# Patient Record
Sex: Female | Born: 1990 | Race: White | Hispanic: Yes | Marital: Single | Smoking: Former smoker
Health system: Southern US, Community
[De-identification: ages and names within clinical notes are randomized; demographics above are authoritative.]

## PROBLEM LIST (undated history)

## (undated) DIAGNOSIS — M76899 Other specified enthesopathies of unspecified lower limb, excluding foot: Secondary | ICD-10-CM

## (undated) DIAGNOSIS — F909 Attention-deficit hyperactivity disorder, unspecified type: Secondary | ICD-10-CM

## (undated) DIAGNOSIS — R1031 Right lower quadrant pain: Secondary | ICD-10-CM

## (undated) DIAGNOSIS — Z8619 Personal history of other infectious and parasitic diseases: Secondary | ICD-10-CM

## (undated) DIAGNOSIS — R5383 Other fatigue: Secondary | ICD-10-CM

## (undated) DIAGNOSIS — S3981XA Other specified injuries of abdomen, initial encounter: Secondary | ICD-10-CM

## (undated) DIAGNOSIS — G4700 Insomnia, unspecified: Secondary | ICD-10-CM

## (undated) DIAGNOSIS — F338 Other recurrent depressive disorders: Secondary | ICD-10-CM

## (undated) DIAGNOSIS — F988 Other specified behavioral and emotional disorders with onset usually occurring in childhood and adolescence: Secondary | ICD-10-CM

## (undated) DIAGNOSIS — G894 Chronic pain syndrome: Secondary | ICD-10-CM

## (undated) DIAGNOSIS — F5104 Psychophysiologic insomnia: Secondary | ICD-10-CM

## (undated) DIAGNOSIS — M722 Plantar fascial fibromatosis: Secondary | ICD-10-CM

## (undated) DIAGNOSIS — R1032 Left lower quadrant pain: Secondary | ICD-10-CM

## (undated) HISTORY — DX: Other specified behavioral and emotional disorders with onset usually occurring in childhood and adolescence: F98.8

## (undated) HISTORY — DX: Chronic pain syndrome: G89.4

## (undated) HISTORY — PX: HERNIA REPAIR: SHX51

## (undated) HISTORY — PX: LAPAROSCOPIC INGUINAL HERNIA REPAIR: SUR788

## (undated) HISTORY — PX: COSMETIC SURGERY: SHX468

## (undated) HISTORY — DX: Insomnia, unspecified: G47.00

## (undated) HISTORY — DX: Other specified injuries of abdomen, initial encounter: S39.81XA

## (undated) HISTORY — DX: Other recurrent depressive disorders: F33.8

---

## 2011-04-16 DIAGNOSIS — F338 Other recurrent depressive disorders: Secondary | ICD-10-CM

## 2011-04-16 HISTORY — DX: Other recurrent depressive disorders: F33.8

## 2017-11-24 ENCOUNTER — Ambulatory Visit (INDEPENDENT_AMBULATORY_CARE_PROVIDER_SITE_OTHER): Payer: BLUE CROSS/BLUE SHIELD | Admitting: Family Medicine

## 2017-11-24 ENCOUNTER — Encounter: Payer: Self-pay | Admitting: Family Medicine

## 2017-11-24 VITALS — BP 94/58 | HR 103 | Temp 98.9°F | Resp 16 | Ht 66.5 in | Wt 123.0 lb

## 2017-11-24 DIAGNOSIS — F5101 Primary insomnia: Secondary | ICD-10-CM | POA: Diagnosis not present

## 2017-11-24 DIAGNOSIS — F39 Unspecified mood [affective] disorder: Secondary | ICD-10-CM

## 2017-11-24 DIAGNOSIS — G4701 Insomnia due to medical condition: Secondary | ICD-10-CM | POA: Diagnosis not present

## 2017-11-24 DIAGNOSIS — E559 Vitamin D deficiency, unspecified: Secondary | ICD-10-CM

## 2017-11-24 DIAGNOSIS — G8921 Chronic pain due to trauma: Secondary | ICD-10-CM

## 2017-11-24 DIAGNOSIS — R5383 Other fatigue: Secondary | ICD-10-CM

## 2017-11-24 DIAGNOSIS — F902 Attention-deficit hyperactivity disorder, combined type: Secondary | ICD-10-CM

## 2017-11-24 MED ORDER — AMPHETAMINE-DEXTROAMPHET ER 10 MG PO CP24: 10.0000 mg | ORAL_CAPSULE | Freq: Every day | ORAL | 0 refills | Status: DC

## 2017-11-24 MED ORDER — AMITRIPTYLINE HCL 10 MG PO TABS: 10.0000 mg | ORAL_TABLET | Freq: Every day | ORAL | 1 refills | Status: DC

## 2017-11-24 NOTE — Progress Notes (Signed)
Subjective:    Patient ID: Erin LewandowskyLauren Pierce, female    DOB: 25-Sep-1990, 27 y.o.   MRN: [id number] Chief Complaint  Patient presents with  . Establish Care    HPI  Leotis ShamesLauren is a delightful 27 yo woman who is here to establish care on the recommendation of her therapist (sees Harless LittenShana Gordon at [facility]).  Chronic pain and MSK Dysfunction Moved to [place] to [place] where she was diagnosed w/ the sports hernias then she moved down to [place] for work but she had to go back there to get diagnosed w/ the adenopathy Starting Yoga which is she is very hopeful will help immensely.   Has not really found anything to help her deal with the constant pain which has required her to stop playing sports and being physically active/exercising which she really loves and has always been her therapy.  Gabapentin for a while which was terrible, Has also failed meloxicam, naproxen, tylenol #3.  No other muscle relaxants helped but her therapist Edson SnowballShana recommended she discuss the possibility of trying zanaflex and valium with me. Feels like physically once one thing starts to get better, than something else goes wrong till eventually eveyrthing is wrong. Very depressing  Mother moved down from [place] to move in w/ her a month ago to help her out which has helped tremendously.   ADHD Was rx adderall for while but took self off during surgery - poor concerntration but no energy to focus it.  Teachers have told her when she was in school that she couldn't focus and had ADD but it was officially diagnosed at about 5818-19 yo when she choose to have herself tested Works at [employer] - can't process things they way she used to  Mood d/o Mild depression really got much worse and hit her sev mos ago when Dr. Jimmie MollyBogges (her Sports Med DO at [facility]) told her he didn't know what the next steps would be to get her out of pain.  dx'd w/ SAD 6-7 yrs ago - she was put on lexapro but she also purchased a very loved  horse at that same time which she really enjoyed riding - so her depression/dysthimia sxs improved immensely but she is not sure if it was the lexapro or the horse (suspect latter).  Starting therapy [facility] has helped a lot - she understands the emotional toll that chronic pain takes but was rec to restart on meds. Insomnia/Fatigue Always tired for several years. Doesn't sleep. Mother and aunts have always been on sleeping meds so always had a little sxs of insomnia that she just attributed to always running in the family. In bed by 9:30 - takes zquil which helps - but wakes up at 1 or 2 but sometimes she doesn't fall back to sleep until 5-6 a.m.     History reviewed. No pertinent past medical history. Past Surgical History:  Procedure Laterality Date  . COSMETIC SURGERY    . HERNIA REPAIR     Current Outpatient Medications on File Prior to Visit  Medication Sig Dispense Refill  . valACYclovir (VALTREX) 500 MG tablet Take 500 mg by mouth 2 (two) times daily.    Marland Kitchen. ACZONE 7.5 % GEL Apply 1 application topically at bedtime. After application of Tazorac  2  . diclofenac sodium (VOLTAREN) 1 % GEL Apply 2 g topically 4 (four) times daily. To affected area  11  . valACYclovir (VALTREX) 1000 MG tablet Take  2 tablets by mouth every 12 (twelve) hours. x2 d after first sign of fever blister  2   No current facility-administered medications on file prior to visit.    No Known Allergies Family History  Problem Relation Age of Onset  . Asthma Maternal Grandmother    Social History   Socioeconomic History  . Marital status: Single    Spouse name: Not on file  . Number of children: Not on file  . Years of education: Not on file  . Highest education level: Not on file  Occupational History  . Not on file  Social Needs  . Financial resource strain: Not on file  . Food insecurity:    Worry: Not on file    Inability: Not on file  . Transportation needs:    Medical: Not on  file    Non-medical: Not on file  Tobacco Use  . Smoking status: Not on file  Substance and Sexual Activity  . Alcohol use: Not on file  . Drug use: Not on file  . Sexual activity: Not on file  Lifestyle  . Physical activity:    Days per week: Not on file    Minutes per session: Not on file  . Stress: Not on file  Relationships  . Social connections:    Talks on phone: Not on file    Gets together: Not on file    Attends religious service: Not on file    Active member of club or organization: Not on file    Attends meetings of clubs or organizations: Not on file    Relationship status: Not on file  Other Topics Concern  . Not on file  Social History Narrative  . Not on file   Depression screen [facility] 2/9 11/24/2017  Decreased Interest 1  Down, Depressed, Hopeless 1  PHQ - 2 Score 2  Altered sleeping 3  Tired, decreased energy 3  Change in appetite 1  Feeling bad or failure about yourself  0  Trouble concentrating 3  Moving slowly or fidgety/restless 0  Suicidal thoughts 0  PHQ-9 Score 12  Difficult doing work/chores Very difficult    Review of Systems See hpi    Objective:   Physical Exam  Constitutional: She is oriented to person, place, and time. She appears well-developed and well-nourished. No distress.  HENT:  Head: Normocephalic and atraumatic.  Right Ear: External ear normal.  Left Ear: External ear normal.  Eyes: Conjunctivae are normal. No scleral icterus.  Neck: Normal range of motion. Neck supple. No thyromegaly present.  Cardiovascular: Normal rate, regular rhythm, normal heart sounds and intact distal pulses.  Pulmonary/Chest: Effort normal and breath sounds normal. No respiratory distress.  Musculoskeletal: She exhibits no edema.  Lymphadenopathy:    She has no cervical adenopathy.  Neurological: She is alert and oriented to person, place, and time.  Skin: Skin is warm and dry. She is not diaphoretic. No erythema.  Psychiatric: She has a normal  mood and affect. Her behavior is normal.  BP (!) 94/58 (BP Location: Left Arm, Patient Position: Sitting, Cuff Size: Normal)   Pulse (!) 103   Temp 98.9 F (37.2 C) (Oral)   Resp 16   Ht 5' 6.5" (1.689 m)   Wt 123 lb (55.8 kg)   LMP 11/24/2017   SpO2 99%   BMI 19.56 kg/m      Assessment & Plan:   1. Episodic mood disorder (HCC)   2. Chronic pain due to trauma  3. Primary insomnia   4. Insomnia due to medical condition   5. Attention deficit hyperactivity disorder (ADHD), combined type   6. Fatigue, unspecified type   7. Vitamin D deficiency     Start below, recheck in 3-4 wks.  Meds ordered this encounter  Medications  . amitriptyline (ELAVIL) 10 MG tablet    Sig: Take 1 tablet (10 mg total) by mouth at bedtime.    Dispense:  30 tablet    Refill:  1  . amphetamine-dextroamphetamine (ADDERALL XR) 10 MG 24 hr capsule    Sig: Take 1 capsule (10 mg total) by mouth daily.    Dispense:  30 capsule    Refill:  0   Today I have utilized the [place] Cont[place] Substance Registry's online query to confirm compliance regarding the patient's controlled medications. My review reveals appropriate prescription fills and that I am the sole provider of these medications. Rechecks will occur regularly and the patient is aware of our use of the system.   Norberto SorensonEva Kyriaki Moder, M.D.  Primary Care at [facility]  [place] [address] [place], [place] [address] [phone] phone [phone] fax  12/26/17 2:14 PM

## 2017-11-24 NOTE — Patient Instructions (Addendum)
   IF you received an x-ray today, you will receive an invoice from [facility]. Please contact [facility] at [phone] with questions or concerns regarding your invoice.   IF you received labwork today, you will receive an invoice from [facility]. Please contact [facility] at [phone] with questions or concerns regarding your invoice.   Our billing staff will not be able to assist you with questions regarding bills from these companies.  You will be contacted with the lab results as soon as they are available. The fastest way to get your results is to activate your My Chart account. Instructions are located on the last page of this paperwork. If you have not heard from us regarding the results in 2 weeks, please contact this office.       Chronic Pain, Adult Chronic pain is a type of pain that lasts or keeps coming back (recurs) for at least six months. You may have chronic headaches, abdominal pain, or body pain. Chronic pain may be related to an illness, such as fibromyalgia or complex regional pain syndrome. Sometimes the cause of chronic pain is not known. Chronic pain can make it hard for you to do daily activities. If not treated, chronic pain can lead to other health problems, including anxiety and depression. Treatment depends on the cause and severity of your pain. You may need to work with a pain specialist to come up with a treatment plan. The plan may include medicine, counseling, and physical therapy. Many people benefit from a combination of two or more types of treatment to control their pain. Follow these instructions at home: Lifestyle  Consider keeping a pain diary to share with your health care providers.  Consider talking with a mental health care provider (psychologist) about how to cope with chronic pain.  Consider joining a chronic pain support group.  Try to control or lower your stress levels. Talk to your health care provider about  strategies to do this. General instructions   Take over-the-counter and prescription medicines only as told by your health care provider.  Follow your treatment plan as told by your health care provider. This may include: ? Gentle, regular exercise. ? Eating a healthy diet that includes foods such as vegetables, fruits, fish, and lean meats. ? Cognitive or behavioral therapy. ? Working with a physical therapist. ? Meditation or yoga. ? Acupuncture or massage therapy. ? Aroma, color, light, or sound therapy. ? Local electrical stimulation. ? Shots (injections) of numbing or pain-relieving medicines into the spine or the area of pain.  Check your pain level as told by your health care provider. Ask your health care provider if you should use a pain scale.  Learn as much as you can about how to manage your chronic pain. Ask your health care provider if an intensive pain rehabilitation program or a chronic pain specialist would be helpful.  Keep all follow-up visits as told by your health care provider. This is important. Contact a health care provider if:  Your pain gets worse.  You have new pain.  You have trouble sleeping.  You have trouble doing your normal activities.  Your pain is not controlled with treatment.  Your have side effects from pain medicine.  You feel weak. Get help right away if:  You lose feeling or have numbness in your body.  You lose control of bowel or bladder function.  Your pain suddenly gets much worse.  You develop shaking or chills.  You develop confusion.    You develop chest pain.  You have trouble breathing or shortness of breath.  You pass out.  You have thoughts about hurting yourself or others. This information is not intended to replace advice given to you by your health care provider. Make sure you discuss any questions you have with your health care provider. Document Released: 12/22/2001 Document Revised: 11/30/2015 Document  Reviewed: 09/19/2015 Elsevier Interactive Patient Education  2018 Elsevier Inc.  

## 2017-12-18 ENCOUNTER — Telehealth: Payer: Self-pay | Admitting: Family Medicine

## 2017-12-18 NOTE — Telephone Encounter (Signed)
I moved the patients appt from 12/26/2017 to 01/19/2018. But in the meantime, she is wanting to know if she can get a prescription for Adderall XR, and Elavil to last until her appt time. Also, she mentioned wanted to get a blood test because she was told that she may have low D3 levels.

## 2017-12-22 NOTE — Telephone Encounter (Signed)
Please advise 

## 2017-12-23 ENCOUNTER — Other Ambulatory Visit: Payer: Self-pay | Admitting: Family Medicine

## 2017-12-23 MED ORDER — AMPHETAMINE-DEXTROAMPHET ER 10 MG PO CP24: 10.0000 mg | ORAL_CAPSULE | Freq: Every day | ORAL | 0 refills | Status: DC

## 2017-12-23 NOTE — Telephone Encounter (Signed)
Relation to pt: self  Call back number:[phone] Pharmacy: [facility] [phone] - [place], [place] - [address] AT [facility] [address] [phone] (Phone) [phone] (Fax)     Reason for call:  Patient checking on the status of amphetamine-dextroamphetamine (ADDERALL XR) 10 MG 24 hr capsule refill request sent in on 12/18/17, please advise

## 2017-12-23 NOTE — Telephone Encounter (Signed)
Just received a call from the [facility] and pt is concerned about her refills.   Please advise on refills for pt has an upcoming appointment on 01/2018 with Dr. Clelia Croft and she was last seen on 11/24/2017.  Pt will be out of the Adderall in the next couple of days.   Thanks, [employer]

## 2017-12-23 NOTE — Telephone Encounter (Signed)
Amitriptyline refill Last Refill: 11/24/17 # 30 Last OV: 11/24/17 PCP: Dr Norberto Sorenson Pharmacy: [facility] [place], [place]

## 2017-12-25 ENCOUNTER — Telehealth: Payer: Self-pay | Admitting: Family Medicine

## 2017-12-25 NOTE — Telephone Encounter (Signed)
Called pt to reschedule visit with Dr. Clelia CroftShaw on 01/19/18. Left Detailed VM. If pt. Calls back please reschedule with any doctor

## 2017-12-26 ENCOUNTER — Encounter: Payer: Self-pay | Admitting: Family Medicine

## 2017-12-26 ENCOUNTER — Ambulatory Visit: Payer: BLUE CROSS/BLUE SHIELD | Admitting: Family Medicine

## 2017-12-26 DIAGNOSIS — F39 Unspecified mood [affective] disorder: Secondary | ICD-10-CM | POA: Insufficient documentation

## 2017-12-26 DIAGNOSIS — F5101 Primary insomnia: Secondary | ICD-10-CM | POA: Insufficient documentation

## 2017-12-26 DIAGNOSIS — F902 Attention-deficit hyperactivity disorder, combined type: Secondary | ICD-10-CM | POA: Insufficient documentation

## 2017-12-26 DIAGNOSIS — G8921 Chronic pain due to trauma: Secondary | ICD-10-CM | POA: Insufficient documentation

## 2017-12-26 DIAGNOSIS — G4701 Insomnia due to medical condition: Secondary | ICD-10-CM | POA: Insufficient documentation

## 2017-12-26 MED ORDER — AMITRIPTYLINE HCL 25 MG PO TABS: 25.0000 mg | ORAL_TABLET | Freq: Every day | ORAL | 2 refills | Status: DC

## 2017-12-26 MED ORDER — AMPHETAMINE-DEXTROAMPHET ER 20 MG PO CP24: 20.0000 mg | ORAL_CAPSULE | ORAL | 0 refills | Status: DC

## 2017-12-26 NOTE — Telephone Encounter (Signed)
Pt's appt is now sched for 11/7  - so will also send in refill for oct for her to have on file and order labs so she can come in for lab only visit - pt notified by MyChart

## 2018-01-15 ENCOUNTER — Encounter: Payer: Self-pay | Admitting: Physician Assistant

## 2018-01-15 ENCOUNTER — Other Ambulatory Visit: Payer: Self-pay

## 2018-01-15 ENCOUNTER — Ambulatory Visit (INDEPENDENT_AMBULATORY_CARE_PROVIDER_SITE_OTHER): Payer: BLUE CROSS/BLUE SHIELD | Admitting: Physician Assistant

## 2018-01-15 VITALS — BP 102/71 | HR 91 | Temp 98.9°F | Resp 18 | Ht 66.85 in | Wt 122.0 lb

## 2018-01-15 DIAGNOSIS — R5383 Other fatigue: Secondary | ICD-10-CM

## 2018-01-15 MED ORDER — AMPHETAMINE-DEXTROAMPHETAMINE 10 MG PO TABS: 10.0000 mg | ORAL_TABLET | Freq: Every day | ORAL | 0 refills | Status: DC | PRN

## 2018-01-15 NOTE — Patient Instructions (Addendum)
  Start taking Adderall 10 mg in the afternoon. If needed, you can increase to 20mg .  Start taking Amitriptyline 35mg  after 1-2 weeks.  Keep your appointment with Dr. Clelia Croft next month.   If you have lab work done today you will be contacted with your lab results within the next 2 weeks.  If you have not heard from [place] then please contact us. The fastest way to get your results is to register for My Chart.   IF you received an x-ray today, you will receive an invoice from [facility]. Please contact [facility] at [phone] with questions or concerns regarding your invoice.   IF you received labwork today, you will receive an invoice from [place]. Please contact [facility] at [phone] with questions or concerns regarding your invoice.   Our billing staff will not be able to assist you with questions regarding bills from these companies.  You will be contacted with the lab results as soon as they are available. The fastest way to get your results is to activate your My Chart account. Instructions are located on the last page of this paperwork. If you have not heard from [place] regarding the results in 2 weeks, please contact this office.

## 2018-01-15 NOTE — Progress Notes (Signed)
Alexandra Lipps  MRN: [id number] DOB: January 07, 1991  PCP: Sherren Mocha, MD  Subjective:  Pt is a 27 year old female PMH chronic pain and fatigue who presents to clinic for medication management.   Overall, she is feeling improvement since starting Amitriptaline and Adderall.  Amitriptaline 25 mg. Still waking up groggy. "It's a push to get anything done" She never feels well rested.  Adderall 20 mg qd. Takes this first thing in the morning.  [facility]renda [facility] at [facility].  Recent labs done for rheumatalgic condtions which were negative.   She has failed Gabapentin, meloxicam, naproxen, tylenol #3.   Review of Systems  Constitutional: Positive for fatigue. Negative for diaphoresis, fever and unexpected weight change.  Musculoskeletal: Positive for arthralgias and myalgias. Negative for neck pain and neck stiffness.    Patient Active Problem List   Diagnosis Date Noted  . Episodic mood disorder (HCC) 12/26/2017  . Chronic pain due to trauma 12/26/2017  . Primary insomnia 12/26/2017  . Insomnia due to medical condition 12/26/2017  . Attention deficit hyperactivity disorder (ADHD), combined type 12/26/2017    Current Outpatient Medications on File Prior to Visit  Medication Sig Dispense Refill  . ACZONE 7.5 % GEL Apply 1 application topically at bedtime. After application of Tazorac  2  . amitriptyline (ELAVIL) 25 MG tablet Take 1 tablet (25 mg total) by mouth at bedtime. Increase to 2 tabs po qhs if tolerated after 2-3 weeks. 60 tablet 2  . amphetamine-dextroamphetamine (ADDERALL XR) 10 MG 24 hr capsule Take 1 capsule (10 mg total) by mouth daily. 30 capsule 0  . [START ON 02/05/2018] amphetamine-dextroamphetamine (ADDERALL XR) 20 MG 24 hr capsule Take 1 capsule (20 mg total) by mouth every morning. 30 capsule 0  . amphetamine-dextroamphetamine (ADDERALL XR) 20 MG 24 hr capsule Take 1 capsule (20 mg total) by mouth every morning. 30 capsule 0  . diclofenac sodium (VOLTAREN) 1 %  GEL Apply 2 g topically 4 (four) times daily. To affected area  11  . valACYclovir (VALTREX) 1000 MG tablet Take 2 tablets by mouth every 12 (twelve) hours. x2 d after first sign of fever blister  2  . valACYclovir (VALTREX) 500 MG tablet Take 500 mg by mouth 2 (two) times daily.    . clindamycin (CLINDAGEL) 1 % gel clindamycin 1 % topical gel    . tazarotene (TAZORAC) 0.1 % gel Tazorac 0.1 % topical gel     No current facility-administered medications on file prior to visit.     No Known Allergies   Objective:  BP 102/71   Pulse 91   Temp 98.9 F (37.2 C) (Oral)   Resp 18   Ht 5' 6.85" (1.698 m)   Wt 122 lb (55.3 kg)   SpO2 98%   BMI 19.19 kg/m   Physical Exam  Constitutional: She is oriented to person, place, and time. No distress.  Cardiovascular: Normal rate, regular rhythm and normal heart sounds.  Neurological: She is alert and oriented to person, place, and time.  Skin: Skin is warm and dry.  Psychiatric: Judgment normal.  Vitals reviewed.   Assessment and Plan :  1. Fatigue, unspecified type - Pt presents for f/u chronic fatigue and pain. Recent rheum labs negative, will check iron and vitamin B12. Con't adderall XR qam and add adderall 10mg  in the afternoon. RTC in 1-2 months to recheck with Dr. Clelia Croft.  - Iron, TIBC and Ferritin Panel - Vitamin B12 - amphetamine-dextroamphetamine (ADDERALL) 10 MG  tablet; Take 1-2 tablets (10-20 mg total) by mouth daily as needed.  Dispense: 60 tablet; Refill: 0   Whitney Blia Totman, PA-C  Primary Care at [facility] 01/15/2018 2:16 PM  Please note: Portions of this report may have been transcribed using dragon voice recognition software. Every effort was made to ensure accuracy; however, inadvertent computerized transcription errors may be present.

## 2018-01-16 LAB — VITAMIN B12: Vitamin B-12: 515 pg/mL (ref 232–1245)

## 2018-01-16 LAB — IRON,TIBC AND FERRITIN PANEL
Ferritin: 31 ng/mL (ref 15–150)
Iron Saturation: 41 % (ref 15–55)
Iron: 109 ug/dL (ref 27–159)
Total Iron Binding Capacity: 268 ug/dL (ref 250–450)
UIBC: 159 ug/dL (ref 131–425)

## 2018-01-19 ENCOUNTER — Ambulatory Visit: Payer: BLUE CROSS/BLUE SHIELD | Admitting: Family Medicine

## 2018-02-19 ENCOUNTER — Other Ambulatory Visit: Payer: Self-pay

## 2018-02-19 ENCOUNTER — Encounter: Payer: Self-pay | Admitting: Family Medicine

## 2018-02-19 ENCOUNTER — Ambulatory Visit (INDEPENDENT_AMBULATORY_CARE_PROVIDER_SITE_OTHER): Payer: BLUE CROSS/BLUE SHIELD | Admitting: Family Medicine

## 2018-02-19 VITALS — BP 114/81 | HR 97 | Resp 16 | Ht 66.54 in | Wt 119.0 lb

## 2018-02-19 DIAGNOSIS — E611 Iron deficiency: Secondary | ICD-10-CM

## 2018-02-19 DIAGNOSIS — M25552 Pain in left hip: Secondary | ICD-10-CM

## 2018-02-19 DIAGNOSIS — G8921 Chronic pain due to trauma: Secondary | ICD-10-CM

## 2018-02-19 DIAGNOSIS — F902 Attention-deficit hyperactivity disorder, combined type: Secondary | ICD-10-CM

## 2018-02-19 DIAGNOSIS — R5383 Other fatigue: Secondary | ICD-10-CM | POA: Diagnosis not present

## 2018-02-19 DIAGNOSIS — F329 Major depressive disorder, single episode, unspecified: Secondary | ICD-10-CM

## 2018-02-19 DIAGNOSIS — F5101 Primary insomnia: Secondary | ICD-10-CM

## 2018-02-19 DIAGNOSIS — M25551 Pain in right hip: Secondary | ICD-10-CM

## 2018-02-19 MED ORDER — AMPHETAMINE-DEXTROAMPHETAMINE 10 MG PO TABS: 10.0000 mg | ORAL_TABLET | Freq: Two times a day (BID) | ORAL | 0 refills | Status: DC | PRN

## 2018-02-19 MED ORDER — AMITRIPTYLINE HCL 75 MG PO TABS: 75.0000 mg | ORAL_TABLET | Freq: Every day | ORAL | 1 refills | Status: DC

## 2018-02-19 MED ORDER — AMPHETAMINE-DEXTROAMPHET ER 20 MG PO CP24: 20.0000 mg | ORAL_CAPSULE | ORAL | 0 refills | Status: DC

## 2018-02-19 MED ORDER — DULOXETINE HCL 30 MG PO CPEP: ORAL_CAPSULE | ORAL | 1 refills | Status: DC

## 2018-02-19 MED ORDER — CYCLOBENZAPRINE HCL 10 MG PO TABS: 10.0000 mg | ORAL_TABLET | Freq: Every day | ORAL | 2 refills | Status: DC

## 2018-02-19 NOTE — Patient Instructions (Addendum)
I recommend starting a daily iron supplement like ferrous sulfate 324mg  (65mg  of elemental iron) which has the highest iron dose of the products commonly available over-the-counter.  It is best absorbed on an empty stomach 30 minutes before eating or 2 hours after a meal with a vitamin C supplement or a small glass of orange juice. This can cause significant constipation in some patients, in which case you may need to switch to a slow release, lower dose supplement like MegaFood Blood Builder (26mg  of elemental iron) which you can find on [employer], Nu-Iron (150mg  of elemental iron), or Slow-Fe (45mg  of elemental iron). Avoid taking antacids, dairy products, tea, or coffee within 2 hours before or after this supplement because they will decrease the iron's absorption.     If you have lab work done today you will be contacted with your lab results within the next 2 weeks.  If you have not heard from [place] then please contact us. The fastest way to get your results is to register for My Chart.   IF you received an x-ray today, you will receive an invoice from [facility]. Please contact [facility] at [phone] with questions or concerns regarding your invoice.   IF you received labwork today, you will receive an invoice from [place]. Please contact [facility] at [phone] with questions or concerns regarding your invoice.   Our billing staff will not be able to assist you with questions regarding bills from these companies.  You will be contacted with the lab results as soon as they are available. The fastest way to get your results is to activate your My Chart account. Instructions are located on the last page of this paperwork. If you have not heard from [place] regarding the results in 2 weeks, please contact this office.      Chronic Pain, Adult Chronic pain is a type of pain that lasts or keeps coming back (recurs) for at least six months. You may have chronic headaches,  abdominal pain, or body pain. Chronic pain may be related to an illness, such as fibromyalgia or complex regional pain syndrome. Sometimes the cause of chronic pain is not known. Chronic pain can make it hard for you to do daily activities. If not treated, chronic pain can lead to other health problems, including anxiety and depression. Treatment depends on the cause and severity of your pain. You may need to work with a pain specialist to come up with a treatment plan. The plan may include medicine, counseling, and physical therapy. Many people benefit from a combination of two or more types of treatment to control their pain. Follow these instructions at home: Lifestyle  Consider keeping a pain diary to share with your health care providers.  Consider talking with a mental health care provider (psychologist) about how to cope with chronic pain.  Consider joining a chronic pain support group.  Try to control or lower your stress levels. Talk to your health care provider about strategies to do this. General instructions   Take over-the-counter and prescription medicines only as told by your health care provider.  Follow your treatment plan as told by your health care provider. This may include: ? Gentle, regular exercise. ? Eating a healthy diet that includes foods such as vegetables, fruits, fish, and lean meats. ? Cognitive or behavioral therapy. ? Working with a Adult nurse. ? Meditation or yoga. ? Acupuncture or massage therapy. ? Aroma, color, light, or sound therapy. ? Local electrical stimulation. ? Shots (injections)  of numbing or pain-relieving medicines into the spine or the area of pain.  Check your pain level as told by your health care provider. Ask your health care provider if you should use a pain scale.  Learn as much as you can about how to manage your chronic pain. Ask your health care provider if an intensive pain rehabilitation program or a chronic pain  specialist would be helpful.  Keep all follow-up visits as told by your health care provider. This is important. Contact a health care provider if:  Your pain gets worse.  You have new pain.  You have trouble sleeping.  You have trouble doing your normal activities.  Your pain is not controlled with treatment.  Your have side effects from pain medicine.  You feel weak. Get help right away if:  You lose feeling or have numbness in your body.  You lose control of bowel or bladder function.  Your pain suddenly gets much worse.  You develop shaking or chills.  You develop confusion.  You develop chest pain.  You have trouble breathing or shortness of breath.  You pass out.  You have thoughts about hurting yourself or others. This information is not intended to replace advice given to you by your health care provider. Make sure you discuss any questions you have with your health care provider. Document Released: 12/22/2001 Document Revised: 11/30/2015 Document Reviewed: 09/19/2015 Elsevier Interactive Patient Education  [employer].
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
## 2018-03-05 ENCOUNTER — Telehealth: Payer: Self-pay | Admitting: Family Medicine

## 2018-03-05 NOTE — Telephone Encounter (Signed)
Called and spoke with pt regarding their appt with Dr. Shaw on 06/01/18. Due to Dr. Shaw’s template change, appt time is pushed up by 10 minutes. I asked pt if this was acceptable and pt agreed. I advised pt of time, building number and late policy. Pt acknowledged.  °

## 2018-03-09 DIAGNOSIS — M25551 Pain in right hip: Secondary | ICD-10-CM | POA: Insufficient documentation

## 2018-03-09 DIAGNOSIS — R5383 Other fatigue: Secondary | ICD-10-CM | POA: Insufficient documentation

## 2018-03-09 DIAGNOSIS — F329 Major depressive disorder, single episode, unspecified: Secondary | ICD-10-CM | POA: Insufficient documentation

## 2018-03-09 DIAGNOSIS — M25552 Pain in left hip: Principal | ICD-10-CM

## 2018-04-21 ENCOUNTER — Other Ambulatory Visit: Payer: Self-pay | Admitting: Family Medicine

## 2018-04-21 NOTE — Telephone Encounter (Signed)
.   Requested Prescriptions  Pending Prescriptions Disp Refills  . DULoxetine (CYMBALTA) 30 MG capsule [Pharmacy Med Name: DULOXETINE DR 30MG  CAPSULES] 60 capsule 0    Sig: TAKE 1 CAPSULE BY MOUTH EVERY DAY FOR 2 WEEKS THEN TAKE 2 CAPSULES BY MOUTH EVERY DAY     Psychiatry: Antidepressants - SNRI Passed - 04/21/2018 10:23 AM      Passed - Completed PHQ-2 or PHQ-9 in the last 360 days.      Passed - Last BP in normal range    BP Readings from Last 1 Encounters:  02/19/18 114/81         Passed - Valid encounter within last 6 months    Recent Outpatient Visits          2 months ago Pain of both hip joints   [facility], Levell July, MD   3 months ago Fatigue, unspecified type   Primary Care at [facility], Madelaine Bhat, PA-C   4 months ago Episodic mood disorder [facility])   [facility] at [facility], Levell July, MD      Future Appointments            In 1 month Sherren Mocha, MD Primary Care at [place], [facility]

## 2018-05-20 ENCOUNTER — Encounter: Payer: Self-pay | Admitting: Family Medicine

## 2018-05-28 ENCOUNTER — Other Ambulatory Visit: Payer: Self-pay | Admitting: Family Medicine

## 2018-05-28 DIAGNOSIS — R5383 Other fatigue: Secondary | ICD-10-CM

## 2018-05-28 NOTE — Telephone Encounter (Signed)
Requested medication (s) are due for refill today: yes  Requested medication (s) are on the active medication list: yes  Last refill:  Elavil 02/19/18, Adderall 20 mg1/19/2020, Adderall 10 mg 03/21/18, Cymbalta 04/21/2018  Future visit scheduled: yes  Notes to clinic:  Not delegated and/or need Sig changed.    Requested Prescriptions  Pending Prescriptions Disp Refills   amphetamine-dextroamphetamine (ADDERALL) 10 MG tablet 60 tablet 0    Sig: Take 1 tablet (10 mg total) by mouth 2 (two) times daily as needed.     Not Delegated - Psychiatry:  Stimulants/ADHD Failed - 05/28/2018 10:11 AM      Failed - This refill cannot be delegated      Failed - Urine Drug Screen completed in last 360 days.      Failed - Valid encounter within last 3 months    Recent Outpatient Visits          3 months ago Pain of both hip joints   [facility], Levell July, MD   4 months ago Fatigue, unspecified type   Primary Care at [facility], Madelaine Bhat, PA-C   6 months ago Episodic mood disorder [facility])   [facility] at [facility], Levell July, MD      Future Appointments            In 3 weeks Sherren Mocha, MD Primary Care at [place], [facility]          amitriptyline (ELAVIL) 75 MG tablet 90 tablet 1    Sig: Take 1 tablet (75 mg total) by mouth at bedtime.     Psychiatry:  Antidepressants - Heterocyclics (TCAs) Passed - 05/28/2018 10:11 AM      Passed - Completed PHQ-2 or PHQ-9 in the last 360 days.      Passed - Valid encounter within last 6 months    Recent Outpatient Visits          3 months ago Pain of both hip joints   [facility] at [facility], Levell July, MD   4 months ago Fatigue, unspecified type   Primary Care at [facility], Madelaine Bhat, PA-C   6 months ago Episodic mood disorder [facility])   [facility] at [facility], Levell July, MD      Future Appointments            In 3 weeks Sherren Mocha, MD Primary Care at [place], [facility]          DULoxetine (CYMBALTA) 30 MG capsule 60  capsule 0    Sig: TAKE 1 CAPSULE BY MOUTH EVERY DAY FOR 2 WEEKS THEN TAKE 2 CAPSULES BY MOUTH EVERY DAY     Psychiatry: Antidepressants - SNRI Passed - 05/28/2018 10:11 AM      Passed - Completed PHQ-2 or PHQ-9 in the last 360 days.      Passed - Last BP in normal range    BP Readings from Last 1 Encounters:  02/19/18 114/81         Passed - Valid encounter within last 6 months    Recent Outpatient Visits          3 months ago Pain of both hip joints   [facility] at [facility], Levell July, MD   4 months ago Fatigue, unspecified type   Primary Care at [facility], Madelaine Bhat, PA-C   6 months ago Episodic mood disorder [facility])   [facility] at [facility], Levell July, MD  Future Appointments            In 3 weeks Sherren Mocha, MD Primary Care at [facility], [facility]          amphetamine-dextroamphetamine (ADDERALL XR) 20 MG 24 hr capsule 30 capsule 0    Sig: Take 1 capsule (20 mg total) by mouth every morning.     Not Delegated - Psychiatry:  Stimulants/ADHD Failed - 05/28/2018 10:11 AM      Failed - This refill cannot be delegated      Failed - Urine Drug Screen completed in last 360 days.      Failed - Valid encounter within last 3 months    Recent Outpatient Visits          3 months ago Pain of both hip joints   [facility] at [facility], Levell July, MD   4 months ago Fatigue, unspecified type   Primary Care at [facility], Madelaine Bhat, PA-C   6 months ago Episodic mood disorder [facility])   [facility] at [facility], Levell July, MD      Future Appointments            In 3 weeks Sherren Mocha, MD Primary Care at [place], [facility]

## 2018-05-28 NOTE — Telephone Encounter (Signed)
[facility] Confirmed by [facility] RPH. Pt has refill that can be transferred.

## 2018-05-28 NOTE — Telephone Encounter (Signed)
Copied from CRM [id number]. Topic: Quick Communication - Rx Refill/Question >> May 28, 2018  9:06 AM Herby Abraham C wrote: Medication: all 90 day supply  amitriptyline (ELAVIL) 75 MG tablet, pt would also like to add Amitriptyline 25MG  also amphetamine-dextroamphetamine (ADDERALL XR) 20 MG 24 hr capsule, amphetamine-dextroamphetamine (ADDERALL) 10 MG tablet DULoxetine (CYMBALTA) 30 MG capsule  Has the patient contacted their pharmacy? Yes   (Agent: If no, request that the patient contact the pharmacy for the refill.) (Agent: If yes, when and what did the pharmacy advise?)  Preferred Pharmacy (with phone number or street name): [id number] [place], [place] - [address] [phone] (Phone) [phone] (Fax)    Agent: Please be advised that RX refills may take up to 3 business days. We ask that you follow-up with your pharmacy.

## 2018-05-28 NOTE — Telephone Encounter (Addendum)
Spoke to patient who states she was going to try decreasing her Elavil. Se was requesting the 25 mg for that purpose. States her appointment with Dr Clelia Croft has been changed to a later date. Pt was advised that she should not change until advised by Dr Clelia Croft. Pt verbalized understanding of instructions.

## 2018-06-01 ENCOUNTER — Ambulatory Visit: Payer: BLUE CROSS/BLUE SHIELD | Admitting: Family Medicine

## 2018-06-01 MED ORDER — AMPHETAMINE-DEXTROAMPHET ER 20 MG PO CP24: 20.0000 mg | ORAL_CAPSULE | ORAL | 0 refills | Status: DC

## 2018-06-01 MED ORDER — DULOXETINE HCL 60 MG PO CPEP: 60.0000 mg | ORAL_CAPSULE | Freq: Every day | ORAL | 0 refills | Status: DC

## 2018-06-01 MED ORDER — AMITRIPTYLINE HCL 75 MG PO TABS: 75.0000 mg | ORAL_TABLET | Freq: Every day | ORAL | 0 refills | Status: DC

## 2018-06-01 MED ORDER — AMPHETAMINE-DEXTROAMPHETAMINE 10 MG PO TABS: 10.0000 mg | ORAL_TABLET | Freq: Two times a day (BID) | ORAL | 0 refills | Status: DC | PRN

## 2018-06-01 NOTE — Telephone Encounter (Signed)
Pt has an appt on 3/11.   Sent in refill x 1 mo to get pt to appt.

## 2018-06-02 ENCOUNTER — Telehealth: Payer: Self-pay | Admitting: Family Medicine

## 2018-06-02 NOTE — Telephone Encounter (Signed)
Copied from CRM [id number]. Topic: Quick Communication - See Telephone Encounter >> Jun 02, 2018 10:58 AM Jens Som A wrote: CRM for notification. See Telephone encounter for: 06/02/18.  Patent is calling for a new 90 script for amphetamine-dextroamphetamine (ADDERALL XR) 20 MG 24 hr capsule [[id number]] & amphetamine-dextroamphetamine (ADDERALL) 10 MG tablet [[id number]] Please advise [id number] [place], [place] - [address] [facility] [address] [place] [address] Phone: [phone] Fax: [phone] Open 24 hours

## 2018-06-02 NOTE — Telephone Encounter (Signed)
Informed pt of Rx being sent to pharmacy, she verbalized understanding.

## 2018-06-12 ENCOUNTER — Other Ambulatory Visit: Payer: Self-pay | Admitting: Family Medicine

## 2018-06-12 DIAGNOSIS — R5383 Other fatigue: Secondary | ICD-10-CM

## 2018-06-12 NOTE — Telephone Encounter (Signed)
Copied from CRM [id number]. Topic: Quick Communication - Rx Refill/Question >> Jun 12, 2018 10:49 AM Richarda Blade wrote: Medication: amphetamine-dextroamphetamine (ADDERALL XR) 20 MG 24 hr capsule   Has the patient contacted their pharmacy? Yes.   (Agent: If yes, when and what did the pharmacy advise?)Pharmacy did not have the medication in stock. Would [facility]  Preferred Pharmacy (with phone number or street name): [id number] [place], [place] - [address] AT [address] [facility] [address] [phone] (Phone) [phone] (Fax)    Agent: Please be advised that RX refills may take up to 3 business days. We ask that you follow-up with your pharmacy.

## 2018-06-12 NOTE — Telephone Encounter (Signed)
Requested medication (s) are due for refill today: yes  Requested medication (s) are on the active medication list: yes  Last refill:  04/06/2019  Future visit scheduled: no Erin Pierce pt  Notes to clinic:  Medication not delegated    Requested Prescriptions  Pending Prescriptions Disp Refills   amphetamine-dextroamphetamine (ADDERALL XR) 20 MG 24 hr capsule 30 capsule 0    Sig: Take 1 capsule (20 mg total) by mouth every morning.     Not Delegated - Psychiatry:  Stimulants/ADHD Failed - 06/12/2018 11:05 AM      Failed - This refill cannot be delegated      Failed - Urine Drug Screen completed in last 360 days.      Failed - Valid encounter within last 3 months    Recent Outpatient Visits          3 months ago Pain of both hip joints   [facility], Levell July, MD   4 months ago Fatigue, unspecified type   Primary Care at [facility], Madelaine Bhat, PA-C   6 months ago Episodic mood disorder [facility])   [facility] at [facility], Levell July, MD      Future Appointments            In 1 week Sherren Mocha, MD Primary Care at [place], [facility]

## 2018-06-13 ENCOUNTER — Telehealth: Payer: Self-pay | Admitting: Family Medicine

## 2018-06-13 NOTE — Telephone Encounter (Signed)
mychart message sent to pt about their appointment with Dr Shaw °

## 2018-06-21 MED ORDER — AMPHETAMINE-DEXTROAMPHETAMINE 10 MG PO TABS: 10.0000 mg | ORAL_TABLET | Freq: Two times a day (BID) | ORAL | 0 refills | Status: DC | PRN

## 2018-06-21 MED ORDER — AMPHETAMINE-DEXTROAMPHET ER 20 MG PO CP24: 20.0000 mg | ORAL_CAPSULE | ORAL | 0 refills | Status: DC

## 2018-06-21 NOTE — Telephone Encounter (Signed)
Last filled adderall xr 20mg  #30 and adderall IR 10 #60 written 2/17 filled 3/4 adderall IR 10mg  #60 written 11/7 filled 2/5 - prior to that filled 12/24, 11/7, 10/3 adderall XR 20mg  #30 written 11/7 filled 1/22 - prior to that filled 12/22, 11/26, 10/24  Sent in rx for April and May so pt will have time to est with me at new practice.

## 2018-06-24 ENCOUNTER — Ambulatory Visit: Payer: BLUE CROSS/BLUE SHIELD | Admitting: Family Medicine

## 2018-06-30 ENCOUNTER — Ambulatory Visit: Payer: BLUE CROSS/BLUE SHIELD | Admitting: Physician Assistant

## 2018-10-01 NOTE — H&P (Signed)
Patient name Erin Pierce, Conkel DICTATION# [id number] CSN# [id number]  Arvella Nigh, MD 10/01/2018 6:16 AM

## 2018-10-01 NOTE — H&P (Signed)
NAMEROBERT, Erin Pierce:[id number] ACCOUNT [id number] DATE OF BIRTH:[birth year] FACILITY: [facility] LOCATION:  PHYSICIAN:Cherry Wittwer Sherran Needs, MD  HISTORY AND PHYSICAL  DATE OF ADMISSION:  10/19/2018  HISTORY OF PRESENT ILLNESS:  The patient is a 28 year old gravida 1, para 0 female who presents for laparoscopic bilateral tubal ligation.  She had a Nexplanon removed last year.  She desires now permanent sterilization.  The potential irreversibility of  sterilization was explained.  A failure rate of 1 in 300 is quoted.  Failures can be in the form of ectopic pregnancy requiring further surgical management.  The patient expressed understanding and is not desirous of any pregnancy in the future after  complete counseling.  ALLERGIES:  She has no known drug allergies.  MEDICATIONS:    1.  She is on Aczone topical gel. 2.  Adderall extended release 20 mg clindamycin 1% topical cream.  3.  Cyclobenzaprine 10 mg tablets.   4.  She is on dextroamphetamine-amphetamine 10 mg tablets.   5.  She is on duloxetine x 20 mg capsule delayed release.   6.  Metronidazole topical cream.   7.  Progesterone 75 mg at bedtime  8.  Tazorac topical gel.   9.  Valacyclovir 1 gram tablet.  PAST MEDICAL HISTORY:  She has a history of adult attention deficit.  She has chronic pain syndrome.  She has insomnia.  PAST SURGICAL HISTORY:  She has had cosmetic surgery.  She had bilateral inguinal hernia repairs.  This was done in 2017.  They did use mesh.  SOCIAL HISTORY:  No tobacco and some alcohol use.  FAMILY HISTORY:  Noncontributory.  REVIEW OF SYSTEMS:  Noncontributory.  PHYSICAL EXAMINATION: VITAL SIGNS:  The patient is afebrile.  Vital signs are stable. HEENT:  The patient is normocephalic.  Pupils equal, round and reactive to light and accommodation.  Extraocular movements are intact.  Sclerae and conjunctivae are clear.  Oropharynx was clear. LUNGS:  Clear to auscultation and  percussion. CARDIOVASCULAR SYSTEM EXAM:  Regular rhythm and rate.  No murmurs or gallops. ABDOMEN:  Benign.  No masses, organomegaly or tenderness. PELVIC:  Normal external genitalia.  Vaginal mucosa is clear.  Cervix unremarkable.  Uterus normal size, shape and contour.  Adnexa free of masses or tenderness. EXTREMITIES:  No swelling or edema. NEUROLOGIC:  Grossly within normal limits.  IMPRESSION: 1.  Desires permanent sterilization. 2.  Chronic pain syndrome. 3.  Adult attention deficit syndrome. 4.  Previous hernia repair.  PLAN:  The patient to undergo a laparoscopic bilateral tubal ligation.  The nature of the procedure has been discussed.  The risks have been explained.  The risks include the risk of infection.  Risk of hemorrhage that could require transfusion with the  risk of AIDS or hepatitis.  Risk of injury to adjacent organs including bladder.  This could require further exploratory surgery.  Risk of deep venous thrombosis and pulmonary embolus.  Again, the permanency of sterilization has been discussed along with  potential failure rate.  The patient understands all this and wants to proceed with permanent sterilization.  AN/NUANCE  D:10/01/2018 T:10/01/2018 JOB:[id number]

## 2018-10-13 NOTE — Progress Notes (Signed)
Per Nira Conn at Dr. Sherran Needs office no Type and screen , or ERAS drink needed for tubal ligation procedures.

## 2018-10-14 ENCOUNTER — Encounter (HOSPITAL_BASED_OUTPATIENT_CLINIC_OR_DEPARTMENT_OTHER): Payer: Self-pay | Admitting: *Deleted

## 2018-10-14 ENCOUNTER — Other Ambulatory Visit: Payer: Self-pay

## 2018-10-14 NOTE — Progress Notes (Signed)

## 2018-10-14 NOTE — Progress Notes (Signed)
Spoke w/ pt via phone for pre-op interview.  Npo after mn.  Arrive at 0530.  Pt getting lab work (cbc, serum preg) and covid test done 10-15-2018 @ 1315.

## 2018-10-15 ENCOUNTER — Other Ambulatory Visit (HOSPITAL_COMMUNITY)
Admission: RE | Admit: 2018-10-15 | Discharge: 2018-10-15 | Disposition: A | Discharge status: Home / Self Care | Payer: BC Managed Care – PPO | Source: Ambulatory Visit | Attending: Obstetrics and Gynecology | Admitting: Obstetrics and Gynecology

## 2018-10-15 ENCOUNTER — Encounter (HOSPITAL_COMMUNITY)
Admission: RE | Admit: 2018-10-15 | Discharge: 2018-10-15 | Disposition: A | Discharge status: Home / Self Care | Payer: BC Managed Care – PPO | Source: Ambulatory Visit | Attending: Obstetrics and Gynecology | Admitting: Obstetrics and Gynecology

## 2018-10-15 DIAGNOSIS — Z01812 Encounter for preprocedural laboratory examination: Secondary | ICD-10-CM | POA: Insufficient documentation

## 2018-10-15 DIAGNOSIS — Z1159 Encounter for screening for other viral diseases: Secondary | ICD-10-CM | POA: Diagnosis not present

## 2018-10-15 LAB — CBC
HCT: 40.5 % (ref 36.0–46.0)
Hemoglobin: 13.2 g/dL (ref 12.0–15.0)
MCH: 31.8 pg (ref 26.0–34.0)
MCHC: 32.6 g/dL (ref 30.0–36.0)
MCV: 97.6 fL (ref 80.0–100.0)
Platelets: 172 10*3/uL (ref 150–400)
RBC: 4.15 MIL/uL (ref 3.87–5.11)
RDW: 11.9 % (ref 11.5–15.5)
WBC: 4.6 10*3/uL (ref 4.0–10.5)
nRBC: 0 % (ref 0.0–0.2)

## 2018-10-15 LAB — HCG, SERUM, QUALITATIVE: Preg, Serum: NEGATIVE

## 2018-10-15 LAB — SARS CORONAVIRUS 2 (TAT 6-24 HRS): SARS Coronavirus 2: NEGATIVE

## 2018-10-18 NOTE — Anesthesia Preprocedure Evaluation (Addendum)
Anesthesia Evaluation  Patient identified by MRN, date of birth, ID band Patient awake    Reviewed: Allergy & Precautions, NPO status , Patient's Chart, lab work & pertinent test results  Airway Mallampati: II  TM Distance: >3 FB     Dental  (+) Dental Advisory Given   Pulmonary former smoker,    breath sounds clear to auscultation       Cardiovascular negative cardio ROS   Rhythm:Regular Rate:Normal     Neuro/Psych negative neurological ROS     GI/Hepatic negative GI ROS, Neg liver ROS,   Endo/Other  negative endocrine ROS  Renal/GU negative Renal ROS     Musculoskeletal   Abdominal   Peds  Hematology negative hematology ROS (+)   Anesthesia Other Findings   Reproductive/Obstetrics                            Lab Results  Component Value Date   WBC 4.6 10/15/2018   HGB 13.2 10/15/2018   HCT 40.5 10/15/2018   MCV 97.6 10/15/2018   PLT 172 10/15/2018   No results found for: CREATININE, BUN, NA, K, CL, CO2  Anesthesia Physical Anesthesia Plan  ASA: I  Anesthesia Plan: General   Post-op Pain Management:    Induction: Intravenous  PONV Risk Score and Plan: 3 and Midazolam, Dexamethasone, Ondansetron and Treatment may vary due to age or medical condition  Airway Management Planned: Oral ETT  Additional Equipment: None  Intra-op Plan:   Post-operative Plan: Extubation in OR  Informed Consent: I have reviewed the patients History and Physical, chart, labs and discussed the procedure including the risks, benefits and alternatives for the proposed anesthesia with the patient or authorized representative who has indicated his/her understanding and acceptance.     Dental advisory given  Plan Discussed with: CRNA  Anesthesia Plan Comments:        Anesthesia Quick Evaluation

## 2018-10-19 ENCOUNTER — Ambulatory Visit (HOSPITAL_BASED_OUTPATIENT_CLINIC_OR_DEPARTMENT_OTHER): Payer: BC Managed Care – PPO | Admitting: Anesthesiology

## 2018-10-19 ENCOUNTER — Other Ambulatory Visit: Payer: Self-pay

## 2018-10-19 ENCOUNTER — Ambulatory Visit (HOSPITAL_BASED_OUTPATIENT_CLINIC_OR_DEPARTMENT_OTHER)
Admission: RE | Admit: 2018-10-19 | Discharge: 2018-10-19 | Disposition: A | Discharge status: Home / Self Care | Payer: BC Managed Care – PPO | Attending: Obstetrics and Gynecology | Admitting: Obstetrics and Gynecology

## 2018-10-19 ENCOUNTER — Encounter (HOSPITAL_BASED_OUTPATIENT_CLINIC_OR_DEPARTMENT_OTHER)
Admission: RE | Disposition: A | Discharge status: Home / Self Care | Payer: Self-pay | Source: Home / Self Care | Attending: Obstetrics and Gynecology

## 2018-10-19 ENCOUNTER — Encounter (HOSPITAL_BASED_OUTPATIENT_CLINIC_OR_DEPARTMENT_OTHER): Payer: Self-pay | Admitting: *Deleted

## 2018-10-19 DIAGNOSIS — Z79890 Hormone replacement therapy (postmenopausal): Secondary | ICD-10-CM | POA: Insufficient documentation

## 2018-10-19 DIAGNOSIS — Z302 Encounter for sterilization: Secondary | ICD-10-CM | POA: Diagnosis present

## 2018-10-19 DIAGNOSIS — G894 Chronic pain syndrome: Secondary | ICD-10-CM | POA: Insufficient documentation

## 2018-10-19 DIAGNOSIS — Z79899 Other long term (current) drug therapy: Secondary | ICD-10-CM | POA: Diagnosis not present

## 2018-10-19 DIAGNOSIS — Z87891 Personal history of nicotine dependence: Secondary | ICD-10-CM | POA: Insufficient documentation

## 2018-10-19 DIAGNOSIS — F988 Other specified behavioral and emotional disorders with onset usually occurring in childhood and adolescence: Secondary | ICD-10-CM | POA: Insufficient documentation

## 2018-10-19 HISTORY — PX: LAPAROSCOPIC TUBAL LIGATION: SHX1937

## 2018-10-19 HISTORY — DX: Left lower quadrant pain: R10.32

## 2018-10-19 HISTORY — DX: Other fatigue: R53.83

## 2018-10-19 HISTORY — DX: Right lower quadrant pain: R10.31

## 2018-10-19 HISTORY — DX: Personal history of other infectious and parasitic diseases: Z86.19

## 2018-10-19 HISTORY — DX: Plantar fascial fibromatosis: M72.2

## 2018-10-19 HISTORY — DX: Other specified enthesopathies of unspecified lower limb, excluding foot: M76.899

## 2018-10-19 HISTORY — DX: Attention-deficit hyperactivity disorder, unspecified type: F90.9

## 2018-10-19 HISTORY — DX: Psychophysiologic insomnia: F51.04

## 2018-10-19 SURGERY — LIGATION, FALLOPIAN TUBE, LAPAROSCOPIC
Anesthesia: General | Site: Abdomen | Laterality: Bilateral

## 2018-10-19 MED ORDER — FENTANYL CITRATE (PF) 100 MCG/2ML IJ SOLN
25.0000 ug | INTRAMUSCULAR | Status: DC | PRN
  Administered 2018-10-19: 25 ug via INTRAVENOUS
  Administered 2018-10-19: 50 ug via INTRAVENOUS
  Administered 2018-10-19: 25 ug via INTRAVENOUS
  Filled 2018-10-19: qty 1

## 2018-10-19 MED ORDER — DEXAMETHASONE SODIUM PHOSPHATE 10 MG/ML IJ SOLN
INTRAMUSCULAR | Status: DC | PRN
  Administered 2018-10-19: 4 mg via INTRAVENOUS

## 2018-10-19 MED ORDER — ONDANSETRON HCL 4 MG/2ML IJ SOLN
INTRAMUSCULAR | Status: AC
  Filled 2018-10-19: qty 2

## 2018-10-19 MED ORDER — OXYCODONE-ACETAMINOPHEN 7.5-325 MG PO TABS: 1.0000 | ORAL_TABLET | ORAL | 0 refills | Status: AC | PRN

## 2018-10-19 MED ORDER — KETOROLAC TROMETHAMINE 30 MG/ML IJ SOLN
30.0000 mg | Freq: Four times a day (QID) | INTRAMUSCULAR | Status: DC | PRN
  Administered 2018-10-19: 30 mg via INTRAVENOUS
  Filled 2018-10-19: qty 1

## 2018-10-19 MED ORDER — LIDOCAINE 2% (20 MG/ML) 5 ML SYRINGE
INTRAMUSCULAR | Status: AC
  Filled 2018-10-19: qty 5

## 2018-10-19 MED ORDER — CEFAZOLIN SODIUM-DEXTROSE 2-4 GM/100ML-% IV SOLN
INTRAVENOUS | Status: AC
  Filled 2018-10-19: qty 100

## 2018-10-19 MED ORDER — CEFAZOLIN SODIUM-DEXTROSE 2-4 GM/100ML-% IV SOLN
2.0000 g | INTRAVENOUS | Status: AC
  Administered 2018-10-19: 2 g via INTRAVENOUS
  Filled 2018-10-19: qty 100

## 2018-10-19 MED ORDER — FENTANYL CITRATE (PF) 100 MCG/2ML IJ SOLN
INTRAMUSCULAR | Status: DC | PRN
  Administered 2018-10-19 (×4): 50 ug via INTRAVENOUS

## 2018-10-19 MED ORDER — ONDANSETRON HCL 4 MG/2ML IJ SOLN
INTRAMUSCULAR | Status: DC | PRN
  Administered 2018-10-19: 4 mg via INTRAVENOUS

## 2018-10-19 MED ORDER — LIDOCAINE HCL (CARDIAC) PF 100 MG/5ML IV SOSY
PREFILLED_SYRINGE | INTRAVENOUS | Status: DC | PRN
  Administered 2018-10-19: 40 mg via INTRAVENOUS

## 2018-10-19 MED ORDER — KETOROLAC TROMETHAMINE 30 MG/ML IJ SOLN
INTRAMUSCULAR | Status: AC
  Filled 2018-10-19: qty 1

## 2018-10-19 MED ORDER — PROMETHAZINE HCL 25 MG/ML IJ SOLN
6.2500 mg | INTRAMUSCULAR | Status: DC | PRN
  Filled 2018-10-19: qty 1

## 2018-10-19 MED ORDER — FENTANYL CITRATE (PF) 100 MCG/2ML IJ SOLN
INTRAMUSCULAR | Status: AC
  Filled 2018-10-19: qty 2

## 2018-10-19 MED ORDER — LACTATED RINGERS IV SOLN
INTRAVENOUS | Status: DC
  Administered 2018-10-19: 07:00:00 via INTRAVENOUS
  Administered 2018-10-19: 50 mL/h via INTRAVENOUS
  Filled 2018-10-19: qty 1000

## 2018-10-19 MED ORDER — ROCURONIUM BROMIDE 100 MG/10ML IV SOLN
INTRAVENOUS | Status: DC | PRN
  Administered 2018-10-19: 40 mg via INTRAVENOUS

## 2018-10-19 MED ORDER — PROPOFOL 10 MG/ML IV BOLUS
INTRAVENOUS | Status: DC | PRN
  Administered 2018-10-19: 170 mg via INTRAVENOUS

## 2018-10-19 MED ORDER — MIDAZOLAM HCL 5 MG/5ML IJ SOLN
INTRAMUSCULAR | Status: DC | PRN
  Administered 2018-10-19: 2 mg via INTRAVENOUS

## 2018-10-19 MED ORDER — DEXAMETHASONE SODIUM PHOSPHATE 10 MG/ML IJ SOLN
INTRAMUSCULAR | Status: AC
  Filled 2018-10-19: qty 1

## 2018-10-19 MED ORDER — PROPOFOL 10 MG/ML IV BOLUS
INTRAVENOUS | Status: AC
  Filled 2018-10-19: qty 20

## 2018-10-19 MED ORDER — BUPIVACAINE HCL (PF) 0.25 % IJ SOLN
INTRAMUSCULAR | Status: AC
  Filled 2018-10-19: qty 30

## 2018-10-19 MED ORDER — SUGAMMADEX SODIUM 200 MG/2ML IV SOLN
INTRAVENOUS | Status: DC | PRN
  Administered 2018-10-19: 200 mg via INTRAVENOUS

## 2018-10-19 MED ORDER — MIDAZOLAM HCL 2 MG/2ML IJ SOLN
INTRAMUSCULAR | Status: AC
  Filled 2018-10-19: qty 2

## 2018-10-19 MED ORDER — BUPIVACAINE HCL 0.25 % IJ SOLN
INTRAMUSCULAR | Status: DC | PRN
  Administered 2018-10-19: 5 mL

## 2018-10-19 SURGICAL SUPPLY — 42 items
APPLICATOR COTTON TIP 6IN STRL (MISCELLANEOUS) IMPLANT
BAG RETRIEVAL 10 (BASKET)
BANDAGE STRIP 1X3 FLEXIBLE (GAUZE/BANDAGES/DRESSINGS) ×1 IMPLANT
CANISTER SUCT 3000ML PPV (MISCELLANEOUS) IMPLANT
CANISTER SUCTION 1200CC (MISCELLANEOUS) IMPLANT
CATH ROBINSON RED A/P 16FR (CATHETERS) ×2 IMPLANT
COVER MAYO STAND STRL (DRAPES) ×2 IMPLANT
COVER WAND RF STERILE (DRAPES) ×2 IMPLANT
DERMABOND ADVANCED (GAUZE/BANDAGES/DRESSINGS) ×1
DERMABOND ADVANCED .7 DNX12 (GAUZE/BANDAGES/DRESSINGS) ×1 IMPLANT
DURAPREP 26ML APPLICATOR (WOUND CARE) ×2 IMPLANT
ELECT REM PT RETURN 9FT ADLT (ELECTROSURGICAL) ×2
ELECTRODE REM PT RTRN 9FT ADLT (ELECTROSURGICAL) ×1 IMPLANT
GAUZE 4X4 16PLY RFD (DISPOSABLE) ×2 IMPLANT
GLOVE BIO SURGEON STRL SZ7 (GLOVE) ×4 IMPLANT
GOWN STRL REUS W/TWL XL LVL3 (GOWN DISPOSABLE) ×2 IMPLANT
KIT TURNOVER CYSTO (KITS) ×2 IMPLANT
NEEDLE INSUFFLATION 120MM (ENDOMECHANICALS) IMPLANT
NS IRRIG 500ML POUR BTL (IV SOLUTION) ×2 IMPLANT
PACK LAPAROSCOPY BASIN (CUSTOM PROCEDURE TRAY) ×2 IMPLANT
PACK TRENDGUARD 450 HYBRID PRO (MISCELLANEOUS) IMPLANT
PAD OB MATERNITY 4.3X12.25 (PERSONAL CARE ITEMS) ×2 IMPLANT
PAD PREP 24X48 CUFFED NSTRL (MISCELLANEOUS) ×2 IMPLANT
SCISSORS LAP 5X45 EPIX DISP (ENDOMECHANICALS) IMPLANT
SEALER TISSUE G2 CVD JAW 45CM (ENDOMECHANICALS) IMPLANT
SET IRRIG TUBING LAPAROSCOPIC (IRRIGATION / IRRIGATOR) IMPLANT
SUT VIC AB 3-0 PS2 18 (SUTURE) ×1
SUT VIC AB 3-0 PS2 18XBRD (SUTURE) ×1 IMPLANT
SUT VICRYL 0 ENDOLOOP (SUTURE) IMPLANT
SUT VICRYL 0 UR6 27IN ABS (SUTURE) ×1 IMPLANT
SUT VICRYL 4-0 PS2 18IN ABS (SUTURE) IMPLANT
SYS BAG RETRIEVAL 10MM (BASKET)
SYSTEM BAG RETRIEVAL 10MM (BASKET) IMPLANT
TOWEL OR 17X26 10 PK STRL BLUE (TOWEL DISPOSABLE) ×4 IMPLANT
TRENDGUARD 450 HYBRID PRO PACK (MISCELLANEOUS)
TROCAR BALLN 12MMX100 BLUNT (TROCAR) IMPLANT
TROCAR OPTI TIP 5M 100M (ENDOMECHANICALS) ×2 IMPLANT
TROCAR XCEL DIL TIP R 11M (ENDOMECHANICALS) IMPLANT
TUBE CONNECTING 12X1/4 (SUCTIONS) IMPLANT
TUBING EVAC SMOKE HEATED PNEUM (TUBING) ×2 IMPLANT
WARMER LAPAROSCOPE (MISCELLANEOUS) ×2 IMPLANT
WATER STERILE IRR 500ML POUR (IV SOLUTION) IMPLANT

## 2018-10-19 NOTE — Discharge Instructions (Signed)
  Post Anesthesia Home Care Instructions  Activity: Get plenty of rest for the remainder of the day. A responsible individual must stay with you for 24 hours following the procedure.  For the next 24 hours, DO NOT: -Drive a car -Operate machinery -Drink alcoholic beverages -Take any medication unless instructed by your physician -Make any legal decisions or sign important papers.  Meals: Start with liquid foods such as gelatin or soup. Progress to regular foods as tolerated. Avoid greasy, spicy, heavy foods. If nausea and/or vomiting occur, drink only clear liquids until the nausea and/or vomiting subsides. Call your physician if vomiting continues.  Special Instructions/Symptoms: Your throat may feel dry or sore from the anesthesia or the breathing tube placed in your throat during surgery. If this causes discomfort, gargle with warm salt water. The discomfort should disappear within 24 hours.  DISCHARGE INSTRUCTIONS: Laparoscopy  The following instructions have been prepared to help you care for yourself upon your return home today.  Wound care:  Do not get the incision wet for the first 24 hours. The incision should be kept clean and dry.  The Band-Aids or dressings may be removed the day after surgery.  Should the incision become sore, red, and swollen after the first week, check with your doctor.  Personal hygiene:  Shower the day after your procedure.  Activity and limitations:  Do NOT drive or operate any equipment today.  Do NOT lift anything more than 15 pounds for 2-3 weeks after surgery.  Do NOT rest in bed all day.  Walking is encouraged. Walk each day, starting slowly with 5-minute walks 3 or 4 times a day. Slowly increase the length of your walks.  Walk up and down stairs slowly.  Do NOT do strenuous activities, such as golfing, playing tennis, bowling, running, biking, weight lifting, gardening, mowing, or vacuuming for 2-4 weeks. Ask your doctor when it is okay to  start.  Diet: Eat a light meal as desired this evening. You may resume your usual diet tomorrow.  Return to work: This is dependent on the type of work you do. For the most part you can return to a desk job within a week of surgery. If you are more active at work, please discuss this with your doctor.  What to expect after your surgery: You may have a slight burning sensation when you urinate on the first day. You may have a very small amount of blood in the urine. Expect to have a small amount of vaginal discharge/light bleeding for 1-2 weeks. It is not unusual to have abdominal soreness and bruising for up to 2 weeks. You may be tired and need more rest for about 1 week. You may experience shoulder pain for 24-72 hours. Lying flat in bed may relieve it.  Call your doctor for any of the following:  Develop a fever of 100.4 or greater  Inability to urinate 6 hours after discharge from hospital  Severe pain not relieved by pain medications  Persistent of heavy bleeding at incision site  Redness or swelling around incision site after a week  Increasing nausea or vomiting      

## 2018-10-19 NOTE — Anesthesia Procedure Notes (Signed)
Procedure Name: Intubation Date/Time: 10/19/2018 7:43 AM Performed by: Garrel Ridgel, CRNA Pre-anesthesia Checklist: Patient identified, Emergency Drugs available, Suction available, Patient being monitored and Timeout performed Patient Re-evaluated:Patient Re-evaluated prior to induction Oxygen Delivery Method: Circle system utilized Preoxygenation: Pre-oxygenation with 100% oxygen Induction Type: IV induction Ventilation: Mask ventilation without difficulty Laryngoscope Size: Mac and 3 Grade View: Grade I Tube type: Oral Tube size: 7.0 mm Number of attempts: 1 Airway Equipment and Method: Stylet Placement Confirmation: ETT inserted through vocal cords under direct vision,  positive ETCO2 and breath sounds checked- equal and bilateral Secured at: 22 cm Tube secured with: Tape Dental Injury: Teeth and Oropharynx as per pre-operative assessment

## 2018-10-19 NOTE — Brief Op Note (Signed)
10/19/2018  8:23 AM  PATIENT:  Erin Pierce  28 y.o. female  PRE-OPERATIVE DIAGNOSIS:  desires sterilization  POST-OPERATIVE DIAGNOSIS:  desires sterilization  PROCEDURE:  Procedure(s): LAPAROSCOPIC TUBAL LIGATION (Bilateral)  SURGEON:  Surgeon(s) and Role:    * Arvella Nigh, MD - Primary  PHYSICIAN ASSISTANT:   ASSISTANTS: none   ANESTHESIA:   general  EBL:  10 mL   BLOOD ADMINISTERED:none  DRAINS: none   LOCAL MEDICATIONS USED:  MARCAINE     SPECIMEN:  No Specimen  DISPOSITION OF SPECIMEN:  N/A  COUNTS:  YES  TOURNIQUET:  * No tourniquets in log *  DICTATION: .Other Dictation: Dictation Number [id number]  PLAN OF CARE: Discharge to home after PACU  PATIENT DISPOSITION:  PACU - hemodynamically stable.   Delay start of Pharmacological VTE agent (>24hrs) due to surgical blood loss or risk of bleeding: not applicable

## 2018-10-19 NOTE — H&P (Signed)
  History and physical exam unchanged 

## 2018-10-19 NOTE — Anesthesia Postprocedure Evaluation (Signed)
Anesthesia Post Note  Patient: Erin Pierce  Procedure(s) Performed: LAPAROSCOPIC TUBAL LIGATION (Bilateral Abdomen)     Patient location during evaluation: PACU Anesthesia Type: General Level of consciousness: awake and alert Pain management: pain level controlled Vital Signs Assessment: post-procedure vital signs reviewed and stable Respiratory status: spontaneous breathing, nonlabored ventilation, respiratory function stable and patient connected to nasal cannula oxygen Cardiovascular status: blood pressure returned to baseline and stable Postop Assessment: no apparent nausea or vomiting Anesthetic complications: no    Last Vitals:  Vitals:   10/19/18 0945 10/19/18 1020  BP: 109/84   Pulse: 72 79  Resp: 14 18  Temp:  36.6 C  SpO2: 100% 100%    Last Pain:  Vitals:   10/19/18 1020  TempSrc:   PainSc: 3                  Tiajuana Amass

## 2018-10-19 NOTE — Transfer of Care (Signed)
Immediate Anesthesia Transfer of Care Note  Patient: Erin Pierce  Procedure(s) Performed: LAPAROSCOPIC TUBAL LIGATION (Bilateral Abdomen)  Patient Location: PACU  Anesthesia Type:General  Level of Consciousness: awake, alert , oriented and patient cooperative  Airway & Oxygen Therapy: Patient Spontanous Breathing and Patient connected to nasal cannula oxygen  Post-op Assessment: Report given to RN and Post -op Vital signs reviewed and stable  Post vital signs: Reviewed and stable  Last Vitals:  Vitals Value Taken Time  BP 113/79 10/19/18 0832  Temp    Pulse 85 10/19/18 0833  Resp 11 10/19/18 0833  SpO2 100 % 10/19/18 0833  Vitals shown include unvalidated device data.  Last Pain:  Vitals:   10/19/18 0548  TempSrc: Oral         Complications: No apparent anesthesia complications

## 2018-10-19 NOTE — Op Note (Signed)
NAMEBLAKLEY, MICHNA MEDICAL RECORD LE:[id number] ACCOUNT [id number] DATE OF BIRTH:Aug 20, 1990 FACILITY: [facility] LOCATION: [facility] PHYSICIAN:Chiniqua Kilcrease Sherran Needs, MD  OPERATIVE REPORT  DATE OF PROCEDURE:  10/19/2018  PREOPERATIVE DIAGNOSIS:  Desires sterility.  POSTOPERATIVE DIAGNOSIS:  Desires sterility.  OPERATIVE PROCEDURE:  Open laparoscopy with laparoscopic bilateral tubal fulguration.  SURGEON:  Darlyn Chamber, MD  ANESTHESIA:  General endotracheal.  ESTIMATED BLOOD LOSS:  Minimal.  PACKS AND DRAINS:  None.  INTRAOPERATIVE BLOOD PLACED:  None.  COMPLICATIONS:  None.  INDICATIONS:  Are as dictated in history and physical.  DESCRIPTION OF PROCEDURE:  The patient was taken to the OR and placed in supine position.  After satisfactory level of general endotracheal anesthesia was obtained, the patient was placed in the dorsal lithotomy position using the Allen stirrups.   Perineum and vagina were prepped out with Betadine.  Bladder was entered via catheterization.  A Hulka tenaculum was put in place and secured.  Speculum was then removed.  Abdomen was prepped with DuraPrep.  After a period of time, she was draped in  sterile field.  Subumbilical incision made with a knife.  The incision was extended through subcutaneous tissue.  Fascia was identified and entered sharply ____ laterally.  Peritoneum was entered.  The open laparoscopic trocar was put in place.   Laparoscope was introduced.  There was no evidence of injury to adjacent organs. Upper abdomen including liver tip, gallbladder, both lateral gutters including appendix were normal.  Uterus was normal size and shape.  Tubes and ovaries were unremarkable.  Bipolar was brought in place.  The mid segment of tube was coagulated for a  distance of 3 cm.  We then recoagulated the same segment of tube completely desiccating the tube on each side.  We carefully evaluated and again it was the tube that had been coagulated and not the round  ligament or the uteroovarian pedicle.  We had good  hemostasis.  No evidence of injury to adjacent organs.  The laparoscope was then removed.  Abdomen was deflated of carbon dioxide.  Trocar was removed.  Subumbilical fascia was closed with figure-of-eight of 0 Vicryl.  Skin was closed with interrupted  subcuticulars of 4-0 Vicryl.  Hulka tenaculum was then removed.  Sponge, instrument and needle count was correct by circulating nurse x2.  The patient tolerated the procedure well and returned to recovery room in good condition.  TN/NUANCE  D:10/19/2018 T:10/19/2018 JOB:[id number]

## 2018-10-20 ENCOUNTER — Encounter (HOSPITAL_BASED_OUTPATIENT_CLINIC_OR_DEPARTMENT_OTHER): Payer: Self-pay | Admitting: Obstetrics and Gynecology

## 2018-12-04 ENCOUNTER — Other Ambulatory Visit: Payer: Self-pay

## 2018-12-04 ENCOUNTER — Encounter: Payer: Self-pay | Admitting: Allergy

## 2018-12-04 ENCOUNTER — Ambulatory Visit (INDEPENDENT_AMBULATORY_CARE_PROVIDER_SITE_OTHER): Payer: BC Managed Care – PPO | Admitting: Allergy

## 2018-12-04 VITALS — BP 112/62 | HR 100 | Temp 97.8°F | Resp 18 | Ht 66.0 in | Wt 108.4 lb

## 2018-12-04 DIAGNOSIS — J3089 Other allergic rhinitis: Secondary | ICD-10-CM

## 2018-12-04 MED ORDER — AZELASTINE HCL 0.1 % NA SOLN: 2.0000 | Freq: Two times a day (BID) | NASAL | 5 refills | Status: DC

## 2018-12-04 NOTE — Progress Notes (Signed)
New Patient Note  RE: Erin LewandowskyLauren Hafen MRN: [id number] DOB: 01-May-1990 Date of Office Visit: 12/04/2018  Referring provider: Jeanann LewandowskyBozeman, Elizabeth, MD Primary care provider: Jeanann LewandowskyBozeman, Elizabeth, MD  Chief Complaint: Chronic rhinorrhea  History of present illness: Erin Pierce is a 28 y.o. female presenting today for evaluation of chronic rhinorrhea. Pt reports her rhinorrhea started after her first nasal surgery in 2005. She was told at the time of surgery this was a possible side effect and thought she would live with it until watching a corrective surgery on television around 2015. She said this started her on the journey to have it possibly fixed with surgery, but after her surgery in 2015 the rhinorrhea continued. Patient was recently evaluated by plastic surgeon who recommended allergy testing before going through another surgery.   Patient believes she may be allergic to Ragweed but has never had allergy testing. The seasons or environment do not change her symptoms. Reports almost complete resolution of her symptoms since starting ipratropium this month.  Denies history of food allergies, urticaria, eczema, and asthma.   Review of systems: Review of Systems  Constitutional: Negative for chills, fever and malaise/fatigue.  HENT: Negative for congestion, ear discharge, ear pain, nosebleeds, sinus pain and sore throat.   Eyes: Negative for pain, discharge and redness.  Respiratory: Negative for cough, shortness of breath and wheezing.   Cardiovascular: Negative for chest pain and palpitations.  Gastrointestinal: Negative for abdominal pain, constipation, diarrhea, heartburn, nausea and vomiting.  Musculoskeletal: Negative for joint pain.  Skin: Negative for itching and rash.  Neurological: Negative for headaches.     All other systems negative unless noted above in HPI  Past medical history: Past Medical History:  Diagnosis Date  . Adductor tendinitis dx 2018   bilateral goin  pain--- hx PRP treatment's 2019 @ [facility]  . ADHD (attention deficit hyperactivity disorder)   . Bilateral groin pain   . Chronic insomnia   . Chronic pain syndrome    secondary to trauma sport injury  . Chronic pain syndrome   . Fatigue   . H/O cold sores   . Plantar fasciitis of left foot   . Seasonal affective disorder (HCC) 2013    Past surgical history: Past Surgical History:  Procedure Laterality Date  . LAPAROSCOPIC INGUINAL [place]ERNIA [place]PA[place]  . LAPAROSCOPIC TUBAL LIGATION Bilateral 10/19/2018   Procedure: LAPAROSCOPIC TUBAL LIGATION;  Surgeon: Richardean ChimeraMcComb, John, MD;  [facility] [place];  Service: Gynecology;  Laterality: Bilateral;    Family history:  Family History  Problem Relation Age of Onset  . Asthma Maternal Grandmother   . Stroke Maternal Grandmother   . Diabetes Maternal Grandmother     Social history: Lives in a home with carpeting with gas heating and central cooling.  No concern for water damage, mildew or roaches in the home.  She works in Education officer, environmentalfinance.  No smoking history.   Medication List: Allergies as of 12/04/2018   No Known Allergies     Medication List       Accurate as of December 04, 2018 12:23 PM. If you have any questions, ask your nurse or doctor.        Aczone 7.5 % Gel Generic drug: Dapsone Apply 1 application topically at bedtime. After application of Tazorac   amphetamine-dextroamphetamine 20 MG 24 hr capsule Commonly known as: ADDERALL XR Take 1 capsule (20 mg total) by mouth every morning.   amphetamine-dextroamphetamine 10 MG  tablet Commonly known as: Adderall Take 1 tablet (10 mg total) by mouth 2 (two) times daily as needed.   clindamycin 1 % gel Commonly known as: CLINDAGEL clindamycin 1 % topical gel   ipratropium 0.03 % nasal spray Commonly known as: ATROVENT Place into the nose.   NALTREXONE HCL PO 1.5 mg.   PROGESTERONE PO Take 75 mg by mouth at bedtime.    Tazorac 0.1 % gel Generic drug: tazarotene   valACYclovir 1000 MG tablet Commonly known as: VALTREX Take 1,000 mg by mouth 2 (two) times daily.       Known medication allergies: No Known Allergies   Physical examination: Blood pressure 112/62, pulse 100, temperature 97.8 F (36.6 C), temperature source Temporal, resp. rate 18, height 5\' 6"  (1.676 m), weight 108 lb 6.4 oz (49.2 kg), SpO2 100 %.  General: Alert, interactive, in no acute distress. HEENT: PERRLA, TMs pearly gray, turbinates non-edematous without discharge, post-pharynx unremarkable. Neck: Supple without lymphadenopathy. Lungs: Clear to auscultation without wheezing, rhonchi or rales. {no increased work of breathing. CV: Normal S1, S2 without murmurs. Abdomen: Nondistended, nontender. Skin: Warm and dry, without lesions or rashes. Extremities:  No clubbing, cyanosis or edema. Neuro:   Grossly intact.  Diagnositics/Labs:  Allergy testing:  Airborne Adult Perc - 12/04/18 0912    Time Antigen Placed  [id number]    Allergen Manufacturer  Waynette ButteryGreer    Location  Back    Number of Test  59    Panel 1  Select    1. Control-Buffer 50% Glycerol  Negative    2. Control-Histamine 1 mg/ml  2+    3. Albumin saline  Negative    4. Bahia  2+    5. [place]  2+    6. Johnson  3+    7. Kentucky Blue  2+    8. Meadow Fescue  2+    9. Perennial Rye  2+    10. Sweet Vernal  3+    11. Timothy  3+    12. Cocklebur  4+    13. Burweed Marshelder  4+    14. Ragweed, short  4+    15. Ragweed, Giant  4+    16. Plantain,  English  3+    17. Lamb's Quarters  2+    18. Sheep Sorrell  2+    19. Rough Pigweed  2+    20. Marsh Elder, Rough  3+    21. Mugwort, Common  4+    22. Ash mix  4+    23. Birch mix  2+    24. Beech American  2+    25. Box, Elder  3+    26. Cedar, red  Negative    27. Cottonwood, Eastern  3+    28. Elm mix  3+    29. Hickory mix  Negative    30. Maple mix  2+    31. Oak, Guinea-Biss[place]astern mix  2+    32. Pecan Pollen   3+    33. Pine mix  Negative    34. Sycamore Eastern  3+    35. Walnut, Black Pollen  3+    36. Alternaria alternata  2+    37. Cladosporium Herbarum  Negative    38. Aspergillus mix  Negative    39. Penicillium mix  Negative    40. Bipolaris sorokiniana (Helminthosporium)  Negative    41. Drechslera spicifera (Curvularia)  Negative    42. Mucor plumbeus  Negative  43. Fusarium moniliforme  Negative    44. Aureobasidium pullulans (pullulara)  Negative    45. Rhizopus oryzae  Negative    46. Botrytis cinera  Negative    47. Epicoccum nigrum  Negative    48. Phoma betae  Negative    49. Candida Albicans  Negative    50. Trichophyton mentagrophytes  Negative    51. Mite, D Farinae  5,000 AU/ml  Negative    52. Mite, D Pteronyssinus  5,000 AU/ml  2+    53. Cat Hair 10,000 BAU/ml  2+    54.  Dog Epithelia  Negative    55. Mixed Feathers  2+    56. Horse Epithelia  Negative    57. Cockroach, German  Negative    58. Mouse  Negative    59. Tobacco Leaf  2+     Intradermal - 12/04/18 0944    Time Antigen Placed  9147    Allergen Manufacturer  Lavella Hammock    Location  Arm    Number of Test  6    Intradermal  Select    Control  Negative    Mold 2  2+    Mold 3  2+    Mold 4  2+    Dog  Negative    Cockroach  Negative      Allergy testing results were read and interpreted by provider, documented by clinical staff.  Assessment and plan: Allergic rhinitis with significant drainage component -  Environmental allergy skin testing today is positive to grass pollens, weed pollens, tree pollens, molds, dust mites, cat, mixed feathers (ie. Down), tobacco leaf.   - Allergen avoidance measures discussed/handouts provided.  Results provided.  - for nasal drainage recommend use of nasal antihistamine Astelin 2 sprays each nostril twice a day.   If you find this more effective than the nasal Ipratropium then would replace it as your maintenance medication.  You can use nasal Ipratropium as needed  for nasal drainage up to 3-4 times a day.  - if needing additional control recommend use of oral long-acting antihistamine like Xyzal 5mg , Allegra 180mg  or Zyrtec 10mg  daily as needed - allergen immunotherapy discussed today including protocol, benefits and risk.  Informational handout provided.  If interested in this therapuetic option you can check with your insurance carrier for coverage.  Let us know if you would like to proceed with this option.    Follow-up 4-6 months or sooner if needed  Tamsen Snider, MD PGY1  [phone]  Attestation: I performed a history and physical examination of the patient and discussed management with the resident. I reviewed the resident's note and agree with the documented findings and plan of care. The note in its entirety was edited by myself, including the physical exam, assessment, and plan.    Prudy Feeler, MD [facility] [place] [facility]

## 2018-12-04 NOTE — Patient Instructions (Addendum)
Chronic Rhinorrhea -  Environmental allergy skin testing today is positive to grass pollens, weed pollens, tree pollens, molds, dust mites, cat, mixed feathers (ie. Down), tobacco leaf.   - Allergen avoidance measures discussed/handouts provided.  Results provided.  - for nasal drainage recommend use of nasal antihistamine Astelin 2 sprays each nostril twice a day.   If you find this more effective than the nasal Ipratropium then would replace it as your maintenance medication.  You can use nasal Ipratropium as needed for nasal drainage up to 3-4 times a day.  - if needing additional control recommend use of oral long-acting antihistamine like Xyzal 5mg , Allegra 180mg  or Zyrtec 10mg  daily as needed - allergen immunotherapy discussed today including protocol, benefits and risk.  Informational handout provided.  If interested in this therapuetic option you can check with your insurance carrier for coverage.  Let us know if you would like to proceed with this option.    Follow-up 4-6 months or sooner if needed

## 2019-03-10 ENCOUNTER — Encounter: Payer: Self-pay | Admitting: Internal Medicine

## 2019-03-26 ENCOUNTER — Other Ambulatory Visit: Payer: Self-pay

## 2019-03-26 ENCOUNTER — Ambulatory Visit (HOSPITAL_COMMUNITY)
Admission: EM | Admit: 2019-03-26 | Discharge: 2019-03-26 | Disposition: A | Discharge status: Home / Self Care | Payer: BC Managed Care – PPO | Attending: Family Medicine | Admitting: Family Medicine

## 2019-03-26 ENCOUNTER — Encounter (HOSPITAL_COMMUNITY): Payer: Self-pay | Admitting: Emergency Medicine

## 2019-03-26 DIAGNOSIS — R002 Palpitations: Secondary | ICD-10-CM | POA: Insufficient documentation

## 2019-03-26 DIAGNOSIS — Z8679 Personal history of other diseases of the circulatory system: Secondary | ICD-10-CM | POA: Diagnosis not present

## 2019-03-26 DIAGNOSIS — F902 Attention-deficit hyperactivity disorder, combined type: Secondary | ICD-10-CM | POA: Insufficient documentation

## 2019-03-26 DIAGNOSIS — G894 Chronic pain syndrome: Secondary | ICD-10-CM | POA: Diagnosis not present

## 2019-03-26 DIAGNOSIS — Z711 Person with feared health complaint in whom no diagnosis is made: Secondary | ICD-10-CM | POA: Diagnosis not present

## 2019-03-26 DIAGNOSIS — Z0389 Encounter for observation for other suspected diseases and conditions ruled out: Secondary | ICD-10-CM | POA: Diagnosis not present

## 2019-03-26 DIAGNOSIS — Z87891 Personal history of nicotine dependence: Secondary | ICD-10-CM | POA: Insufficient documentation

## 2019-03-26 DIAGNOSIS — Z79899 Other long term (current) drug therapy: Secondary | ICD-10-CM | POA: Insufficient documentation

## 2019-03-26 LAB — TSH: TSH: 1.314 u[IU]/mL (ref 0.350–4.500)

## 2019-03-26 LAB — CBC WITH DIFFERENTIAL/PLATELET
Abs Immature Granulocytes: 0.01 10*3/uL (ref 0.00–0.07)
Basophils Absolute: 0 10*3/uL (ref 0.0–0.1)
Basophils Relative: 1 %
Eosinophils Absolute: 0.1 10*3/uL (ref 0.0–0.5)
Eosinophils Relative: 1 %
HCT: 42 % (ref 36.0–46.0)
Hemoglobin: 14.1 g/dL (ref 12.0–15.0)
Immature Granulocytes: 0 %
Lymphocytes Relative: 29 %
Lymphs Abs: 1.4 10*3/uL (ref 0.7–4.0)
MCH: 31.7 pg (ref 26.0–34.0)
MCHC: 33.6 g/dL (ref 30.0–36.0)
MCV: 94.4 fL (ref 80.0–100.0)
Monocytes Absolute: 0.3 10*3/uL (ref 0.1–1.0)
Monocytes Relative: 5 %
Neutro Abs: 3.2 10*3/uL (ref 1.7–7.7)
Neutrophils Relative %: 64 %
Platelets: 217 10*3/uL (ref 150–400)
RBC: 4.45 MIL/uL (ref 3.87–5.11)
RDW: 11.9 % (ref 11.5–15.5)
WBC: 4.9 10*3/uL (ref 4.0–10.5)
nRBC: 0 % (ref 0.0–0.2)

## 2019-03-26 NOTE — Discharge Instructions (Addendum)
I am doing blood work to check your thyroid and blood count.  This is looking for anemia or infection. Avoid caffeine Hold on the adderall. Follow up with your primary doctor next week

## 2019-03-26 NOTE — ED Triage Notes (Signed)
Pt c/o palpitations onset 6 days.... denies any other sx  Sts she had COVID testing 2 days ago and it was negative  Hx of tachycardia  Also reports new job  A&O x 4... NAD.Marland Kitchen. ambulatory

## 2019-03-26 NOTE — ED Provider Notes (Signed)
[facility]    CSN: [id number] Arrival date & time: 03/26/19  1250      History   Chief Complaint Chief Complaint  Patient presents with  . Palpitations    HPI Erin Pierce is a 28 y.o. female.   HPI\  Patient is here for "palpitations".  She is using this term to describe a rapid heart rate.  She states her heart rates been high for the last week.  She states it is "exhausting".  She states she is aware of her fast heartbeat all of the time.  She states that she was on Adderall, and then stopped this a week ago when her heart rate went high.  She states she has been on Adderall for a long time and it has never caused her to have a high heart rate before.  She states that she has reduced her caffeine.  Trying to eat normally.  Trying to drink normally.  Trying to get enough sleep.  She did start a new job but states that this is not stressful for her.  No signs of infection, fever or chills, cough or shortness of breath.  She did get a coronavirus test and the rapid test was negative, she is awaiting confirmation on this.  As she tried to contact her primary care doctor but they are out this week.  She states that she does have a chronic fatigue. Denies taking any medications, drugs, supplements  Past Medical History:  Diagnosis Date  . Adductor tendinitis dx 2018   bilateral goin pain--- hx PRP treatment's 2019 @ [facility]  . ADHD (attention deficit hyperactivity disorder)   . Bilateral groin pain   . Chronic insomnia   . Chronic pain syndrome    secondary to trauma sport injury  . Chronic pain syndrome   . Fatigue   . H/O cold sores   . Plantar fasciitis of left foot   . Seasonal affective disorder [facility]) 2013    Patient Active Problem List   Diagnosis Date Noted  . Reactive depression 03/09/2018  . Fatigue 03/09/2018  . Pain of both hip joints 03/09/2018  . Episodic mood disorder (HCC) 12/26/2017  . Chronic pain due to trauma 12/26/2017  . Primary insomnia  12/26/2017  . Insomnia due to medical condition 12/26/2017  . Attention deficit hyperactivity disorder (ADHD), combined type 12/26/2017    Past Surgical History:  Procedure Laterality Date  . LAPAROSCOPIC INGUINAL HERNIA REPAIR Bilateral 06-02-2015  in [place], [place]   w/ mesh  . LAPAROSCOPIC TUBAL LIGATION Bilateral 10/19/2018   Procedure: LAPAROSCOPIC TUBAL LIGATION;  Surgeon: Richardean Chimera, MD;  Location: [facility];  Service: Gynecology;  Laterality: Bilateral;    OB History   No obstetric history on file.      Home Medications    Prior to Admission medications   Medication Sig Start Date End Date Taking? Authorizing Provider  ACZONE 7.5 % GEL Apply 1 application topically at bedtime. After application of Tazorac 11/06/17   [provider]  amphetamine-dextroamphetamine (ADDERALL XR) 20 MG 24 hr capsule Take 1 capsule (20 mg total) by mouth every morning. 08/12/18   Sherren Mocha, MD  amphetamine-dextroamphetamine (ADDERALL) 10 MG tablet Take 1 tablet (10 mg total) by mouth 2 (two) times daily as needed. 08/12/18   Sherren Mocha, MD  azelastine (ASTELIN) 0.1 % nasal spray Place 2 sprays into both nostrils 2 (two) times daily. Use in each nostril as directed 12/04/18   Marcelyn Bruins,  MD  clindamycin (CLINDAGEL) 1 % gel clindamycin 1 % topical gel    [provider]  ipratropium (ATROVENT) 0.03 % nasal spray Place into the nose. 11/17/18   [provider]  NALTREXONE HCL PO 1.5 mg.    [provider]  Progesterone Micronized (PROGESTERONE PO) Take 75 mg by mouth at bedtime.    [provider]  tazarotene (TAZORAC) 0.1 % gel     [provider]  valACYclovir (VALTREX) 1000 MG tablet Take 1,000 mg by mouth 2 (two) times daily.  11/06/17   Celedonio Savage, PA-C    Family History Family History  Problem Relation Age of Onset  . Asthma Maternal Grandmother   . Stroke Maternal Grandmother   . Diabetes Maternal  Grandmother     Social History Social History   Tobacco Use  . Smoking status: Former Smoker    Years: 5.00    Types: Cigarettes    Quit date: 10/14/2011    Years since quitting: 7.4  . Smokeless tobacco: Never Used  Substance Use Topics  . Alcohol use: Yes    Comment: 2-3 per day  . Drug use: Never     Allergies   Patient has no known allergies.   Review of Systems Review of Systems  Constitutional: Negative for chills and fever.  HENT: Negative for congestion and hearing loss.   Eyes: Negative for pain.  Respiratory: Negative for cough and shortness of breath.   Cardiovascular: Negative for chest pain and leg swelling.       Fast heart rate per patient  Gastrointestinal: Negative for abdominal pain, constipation and diarrhea.  Genitourinary: Negative for dysuria and frequency.  Musculoskeletal: Negative for myalgias.  Neurological: Negative for dizziness, seizures and headaches.  Psychiatric/Behavioral: The patient is not nervous/anxious.      Physical Exam Triage Vital Signs ED Triage Vitals  Enc Vitals Group     BP 03/26/19 1308 118/78     Pulse Rate 03/26/19 1308 97     Resp 03/26/19 1308 18     Temp 03/26/19 1308 98.4 F (36.9 C)     Temp Source 03/26/19 1308 Oral     SpO2 03/26/19 1308 96 %     Weight --      Height --      Head Circumference --      Peak Flow --      Pain Score 03/26/19 1309 0     Pain Loc --      Pain Edu? --      Excl. in [place]? --    No data found.  Updated Vital Signs BP 118/78 (BP Location: Right Arm)   Pulse 97   Temp 98.4 F (36.9 C) (Oral)   Resp 18   LMP 03/16/2019   SpO2 96%     Physical Exam Constitutional:      General: She is not in acute distress.    Appearance: She is well-developed and normal weight.     Comments: Patient is lean in appearance  HENT:     Head: Normocephalic and atraumatic.     Right Ear: Tympanic membrane and ear canal normal.     Left Ear: Tympanic membrane and ear canal normal.      Nose: Nose normal. No congestion.     Mouth/Throat:     Mouth: Mucous membranes are moist.  Eyes:     Conjunctiva/sclera: Conjunctivae normal.     Pupils: Pupils are equal, round, and reactive to light.  Neck:     Comments: No thyromegaly Cardiovascular:     Rate and Rhythm: Normal rate and regular rhythm.     Heart sounds: Normal heart sounds.     Comments: Normal heart rate Pulmonary:     Effort: Pulmonary effort is normal. No respiratory distress.     Breath sounds: Normal breath sounds.  Abdominal:     General: Abdomen is flat. Bowel sounds are normal. There is no distension.     Palpations: Abdomen is soft. There is no mass.  Musculoskeletal:        General: Normal range of motion.     Cervical back: Normal range of motion and neck supple.     Right lower leg: No edema.     Left lower leg: No edema.  Lymphadenopathy:     Cervical: No cervical adenopathy.  Skin:    General: Skin is warm and dry.  Neurological:     General: No focal deficit present.     Mental Status: She is alert.     Deep Tendon Reflexes: Reflexes normal.  Psychiatric:        Mood and Affect: Mood normal.        Behavior: Behavior normal.      UC Treatments / Results  Labs (all labs ordered are listed, but only abnormal results are displayed) Labs Reviewed  CBC WITH DIFFERENTIAL/PLATELET  TSH    EKG   Radiology No results found.  Procedures Procedures (including critical care time)  Medications Ordered in UC Medications - No data to display  Initial Impression / Assessment and Plan / UC Course  I have reviewed the triage vital signs and the nursing notes.  Pertinent labs & imaging results that were available during my care of the patient were reviewed by me and considered in my medical decision making (see chart for details).     History does not give clear diagnosis for rapid heart rate.  Physical examination is normal.  I suspect Adderall, stress, caffeine may be playing a part.   We will do some blood work and have her follow-up with her physician next week. Final Clinical Impressions(s) / UC Diagnoses   Final diagnoses:  History of sinus tachycardia     Discharge Instructions     I am doing blood work to check your thyroid and blood count.  This is looking for anemia or infection. Avoid caffeine Hold on the adderall. Follow up with your primary doctor next week   ED Prescriptions    None     PDMP not reviewed this encounter.   Eustace MooreNelson, Elgie Landino Sue, MD 03/26/19 [phone]

## 2019-05-31 ENCOUNTER — Ambulatory Visit: Payer: BC Managed Care – PPO | Admitting: Internal Medicine

## 2019-07-19 ENCOUNTER — Ambulatory Visit (INDEPENDENT_AMBULATORY_CARE_PROVIDER_SITE_OTHER): Payer: 59 | Admitting: Internal Medicine

## 2019-07-19 ENCOUNTER — Other Ambulatory Visit: Payer: Self-pay

## 2019-07-19 ENCOUNTER — Encounter: Payer: Self-pay | Admitting: Internal Medicine

## 2019-07-19 VITALS — Ht 66.0 in | Wt 115.0 lb

## 2019-07-19 DIAGNOSIS — R5382 Chronic fatigue, unspecified: Secondary | ICD-10-CM

## 2019-07-19 NOTE — Progress Notes (Signed)
I connected with  Erin Pierce on 07/19/19 by phone and verified that I am speaking with the correct person using two identifiers.   I discussed the limitations of evaluation and management by telemedicine. The patient expressed understanding and agreed to proceed.  HPI: This is a new patient visit from [name] for a recent positive EBV IgG serology.   I spoke with both the patient and her mother were on the line together.  She also sent a summary regarding her symptoms that have dated back about 7 years.  Her main complaints have been chronic fatigue which has progressively worsened over the last year, "brain fog", and inability to concentrate.  She has been to multiple doctors and has had many tests which have been unrevealing.  To date test that she knows she has had include thyroid studies, basic metabolic panel as well as anemia panel.  She has had multiple labs by the [name] including tick related illnesses, EBV, CMV, coxsackie, other tests also include fungal allergens of which one type of Aspergillus was positive.  She requested evaluation by infectious diseases with question if Candida infection chronically could be related or hormone issues.  She is not sure if she has been tested ever for HIV or chronic viral hepatitis.  She states that she is desperately seeking answers and does not feel she can continue feeling like this going forward.  She states she sleeps about 6 hours a night though is unsure of how well she sleeps.  She does not know of any snoring or apnea issues.  She has been recently on herbal medications and other probiotics but nothing that she was on prior to what her symptoms started.  She has had one EBV test that was positive for the IgG as well as coxsackievirus IgG positive tests however all IgM tests were negative.  She was given on "EBV protocol" by the provider at the [facility] however had some benefit initially  but did not last.  She has had mold testing at home and was negative.  She has had no rashes, joint swelling, diarrhea, constipation, or weight loss.  She continues to work from home and has not had any issues keeping her job.  She though has no energy for exercise.  She does ride horses and has been able to continue with horse shows but gets extremely fatigued with those activities.  She has been on Adderall for many years.  SH: former smoker.

## 2019-07-19 NOTE — Assessment & Plan Note (Signed)
This has been a long standing issue and no etiology identified.  From an infectious disease standpoint, chronic candidal infection such as candidal overgrowth syndrome is not a known true syndrome and evaluation or treatment of that is not indicated.  Hormone imbalance would not be related to infectious disease so I would defer any work-up or evaluation to her gynecologist. Other infectious diseases that do tend to be related to chronic fatigue and CNS issues include particularly chronic hepatitis C as well as hepatitis B or HIV.  She does have 1 risk factor for hepatitis C with a blood transfusion as an infant, presumably prior to widespread screening of hepatitis C.  Therefore I do think it is prudent to check most 3 infections to rule those out and she is in agreement will come to our lab for that.  If those are negative, there is nothing further from infectious disease standpoint to evaluate.  I did suggest consideration of a sleep lab and evaluation of her sleep habits as well as her sleep quality as certainly that could be related to some of the progression if not the underlying issue.  Once she has the blood tests, these will be discussed with her and if there is anything positive then we can continue with treatment.  Otherwise she can return on a as needed basis.  45 minutes spent on visit.   Thank you for the consultation and please call with any questions.

## 2019-09-30 ENCOUNTER — Other Ambulatory Visit: Payer: Self-pay | Admitting: Radiology

## 2019-09-30 DIAGNOSIS — N6320 Unspecified lump in the left breast, unspecified quadrant: Secondary | ICD-10-CM

## 2019-09-30 DIAGNOSIS — N6310 Unspecified lump in the right breast, unspecified quadrant: Secondary | ICD-10-CM

## 2019-10-05 ENCOUNTER — Other Ambulatory Visit: Payer: Self-pay

## 2019-10-05 ENCOUNTER — Ambulatory Visit
Admission: RE | Admit: 2019-10-05 | Discharge: 2019-10-05 | Disposition: A | Discharge status: Home / Self Care | Payer: 59 | Source: Ambulatory Visit | Attending: Radiology | Admitting: Radiology

## 2019-10-05 ENCOUNTER — Other Ambulatory Visit: Payer: Self-pay | Admitting: Radiology

## 2019-10-05 DIAGNOSIS — N6320 Unspecified lump in the left breast, unspecified quadrant: Secondary | ICD-10-CM

## 2019-10-08 ENCOUNTER — Other Ambulatory Visit: Payer: 59

## 2019-11-08 ENCOUNTER — Ambulatory Visit: Payer: 59 | Admitting: Rheumatology

## 2020-05-05 ENCOUNTER — Other Ambulatory Visit: Payer: Self-pay | Admitting: Plastic Surgery

## 2020-05-05 DIAGNOSIS — N644 Mastodynia: Secondary | ICD-10-CM

## 2020-05-22 ENCOUNTER — Ambulatory Visit
Admission: RE | Admit: 2020-05-22 | Discharge: 2020-05-22 | Disposition: A | Discharge status: Home / Self Care | Payer: 59 | Source: Ambulatory Visit | Attending: Plastic Surgery | Admitting: Plastic Surgery

## 2020-05-22 ENCOUNTER — Other Ambulatory Visit: Payer: Self-pay

## 2020-05-22 ENCOUNTER — Other Ambulatory Visit: Payer: Self-pay | Admitting: Plastic Surgery

## 2020-05-22 DIAGNOSIS — N644 Mastodynia: Secondary | ICD-10-CM

## 2020-05-25 ENCOUNTER — Inpatient Hospital Stay: Admission: RE | Admit: 2020-05-25 | Payer: 59 | Source: Ambulatory Visit

## 2020-05-25 ENCOUNTER — Other Ambulatory Visit: Payer: 59

## 2020-06-08 ENCOUNTER — Institutional Professional Consult (permissible substitution): Payer: 59 | Admitting: Plastic Surgery

## 2020-06-16 ENCOUNTER — Other Ambulatory Visit: Payer: 59

## 2020-06-21 ENCOUNTER — Other Ambulatory Visit: Payer: 59

## 2021-01-17 ENCOUNTER — Ambulatory Visit: Payer: 59 | Admitting: Cardiology

## 2021-01-17 ENCOUNTER — Other Ambulatory Visit: Payer: Self-pay

## 2021-01-17 ENCOUNTER — Encounter: Payer: Self-pay | Admitting: Cardiology

## 2021-01-17 ENCOUNTER — Inpatient Hospital Stay: Payer: 59

## 2021-01-17 VITALS — BP 109/72 | HR 112 | Temp 98.3°F | Resp 16 | Ht 66.0 in | Wt 117.0 lb

## 2021-01-17 DIAGNOSIS — F32A Depression, unspecified: Secondary | ICD-10-CM

## 2021-01-17 DIAGNOSIS — R000 Tachycardia, unspecified: Secondary | ICD-10-CM

## 2021-01-17 DIAGNOSIS — R0602 Shortness of breath: Secondary | ICD-10-CM

## 2021-01-17 DIAGNOSIS — F908 Attention-deficit hyperactivity disorder, other type: Secondary | ICD-10-CM

## 2021-01-17 NOTE — Progress Notes (Signed)
Date:  01/17/2021   ID:  Nestor Lewandowsky, DOB 12/14/1990, MRN [id number]  PCP:  Melida Quitter, MD  Cardiologist:  Tessa Lerner, DO, [facility] (established care 01/17/2021)  REASON FOR CONSULT: Tachycardia  REQUESTING PHYSICIAN:  Dorinda Hill   Chief Complaint  Patient presents with   Tachycardia   New Patient (Initial Visit)   Shortness of Breath    HPI  Erin Pierce is a 30 y.o. female who presents to the office with a chief complaint of " high heart rate and shortness of breath." Patient's past medical history and cardiovascular risk factors include: ADHD, depression, history of COVID-19 infection, former smoker, chronic fatigue syndrome.   She is referred to the office at the request of Dr. Dorinda Hill for evaluation of tachycardia.  Patient comes to the office for evaluation of tachycardia.  Patient states that the symptoms are persistent throughout the entire day and associated with shortness of breath at times.  Initially the thought process was that may be secondary to her Adderall.  She had stopped Adderall for 3 weeks and also minimize her caffeine intake to a very minimal amount of quantities but her tachycardia/palpitations continued.  Patient states that it is most likely secondary to her stress and comorbid conditions.  Patient denies any near-syncope or syncopal events.  Patient consumes approximately 2 alcoholic beverages couple times a week, consume 60 to 90 mg of caffeine per day.  No illicit drugs, marijuana use, energy drinks, or other stimulants except Adderall.  Patient states that her ADHD was diagnosed at the age of 85 and her dose of Adderall has been increased in a stepwise fashion as he is currently on 60 mg p.o. daily with short acting formulation as well.  FUNCTIONAL STATUS: No structured exercise program or daily routine.   ALLERGIES: No Known Allergies  MEDICATION LIST PRIOR TO VISIT: Current Meds  Medication Sig   ACZONE 7.5 % GEL Apply 1 application  topically at bedtime. After application of Tazorac   amphetamine-dextroamphetamine (ADDERALL XR) 20 MG 24 hr capsule Take 1 capsule (20 mg total) by mouth every morning.   amphetamine-dextroamphetamine (ADDERALL) 10 MG tablet Take 1 tablet (10 mg total) by mouth 2 (two) times daily as needed.   azelastine (ASTELIN) 0.1 % nasal spray Place 2 sprays into both nostrils 2 (two) times daily. Use in each nostril as directed     PAST MEDICAL HISTORY: Past Medical History:  Diagnosis Date   Adductor tendinitis dx 2018   bilateral goin pain--- hx PRP treatment's 2019 @ [facility]   ADHD (attention deficit hyperactivity disorder)    Bilateral groin pain    Chronic insomnia    Chronic pain syndrome    secondary to trauma sport injury   Chronic pain syndrome    Fatigue    H/O cold sores    Plantar fasciitis of left foot    Seasonal affective disorder (HCC) 2013    PAST SURGICAL HISTORY: Past Surgical History:  Procedure Laterality Date   LAPAROSCOPIC INGUINAL HERNIA REPAIR Bilateral 06-02-2015  in [place], [place]   w/ mesh   LAPAROSCOPIC TUBAL LIGATION Bilateral 10/19/2018   Procedure: LAPAROSCOPIC TUBAL LIGATION;  Surgeon: Richardean Chimera, MD;  Location: [facility];  Service: Gynecology;  Laterality: Bilateral;    FAMILY HISTORY: The patient family history includes Asthma in her maternal grandmother; Diabetes in her maternal grandmother; Stroke in her maternal grandmother.  SOCIAL HISTORY:  The patient  reports that she quit smoking about 9 years ago. Her  smoking use included cigarettes. She has a 2.50 pack-year smoking history. She has never used smokeless tobacco. She reports current alcohol use. She reports that she does not use drugs.  REVIEW OF SYSTEMS: Review of Systems  Constitutional: Negative for chills and fever.  HENT:  Positive for tinnitus. Negative for hoarse voice and nosebleeds.   Eyes:  Negative for discharge, double vision and pain.  Cardiovascular:  Positive  for palpitations (tachycarida). Negative for chest pain, claudication, dyspnea on exertion, leg swelling, near-syncope, orthopnea, paroxysmal nocturnal dyspnea and syncope.  Respiratory:  Positive for shortness of breath. Negative for hemoptysis.   Musculoskeletal:  Negative for muscle cramps and myalgias.  Gastrointestinal:  Negative for abdominal pain, constipation, diarrhea, hematemesis, hematochezia, melena, nausea and vomiting.  Neurological:  Negative for dizziness and light-headedness.   PHYSICAL EXAM: Vitals with BMI 01/17/2021 07/19/2019 03/26/2019  Height 5\' 6"  5\' 6"  -  Weight 117 lbs 115 lbs -  BMI 18.89 18.57 -  Systolic 109 - 118  Diastolic 72 - 78  Pulse 112 - 97    CONSTITUTIONAL: Well-developed and well-nourished. No acute distress.  SKIN: Skin is warm and dry. No rash noted. No cyanosis. No pallor. No jaundice HEAD: Normocephalic and atraumatic.  EYES: No scleral icterus MOUTH/THROAT: Moist oral membranes.  NECK: No JVD present. No thyromegaly noted. No carotid bruits  LYMPHATIC: No visible cervical adenopathy.  CHEST Normal respiratory effort. No intercostal retractions  LUNGS: Clear to auscultation bilaterally.  No stridor. No wheezes. No rales.  CARDIOVASCULAR: Tachycardia, regular, positive S1-S2, no murmurs rubs or gallops appreciated secondary to tachycardia. ABDOMINAL: Soft, nontender, nondistended, positive bowel sounds in all 4 quadrants, no apparent ascites.  EXTREMITIES: No peripheral edema  HEMATOLOGIC: No significant bruising NEUROLOGIC: Oriented to person, place, and time. Nonfocal. Normal muscle tone.  PSYCHIATRIC: Normal mood and affect. Normal behavior. Cooperative  CARDIAC DATABASE: EKG: 01/17/2021: Normal sinus rhythm, 94 bpm, normal axis, without underlying ischemia or injury pattern.   Echocardiogram: No results found for this or any previous visit from the past 1095 days.   Stress Testing: No results found for this or any previous visit from  the past 1095 days.  Heart Catheterization: None  LABORATORY DATA: CBC Latest Ref Rng & Units 03/26/2019 10/15/2018  WBC 4.0 - 10.5 K/uL 4.9 4.6  Hemoglobin 12.0 - 15.0 g/dL 14/02/2019 12/16/2018  Hematocrit 33.8 - 46.0 % 42.0 40.5  Platelets 150 - 400 K/uL 217 172    No flowsheet data found.  Lipid Panel  No results found for: CHOL, TRIG, HDL, CHOLHDL, VLDL, LDLCALC, LDLDIRECT, LABVLDL  No components found for: NTPROBNP No results for input(s): PROBNP in the last 8760 hours. No results for input(s): TSH in the last 8760 hours.  BMP No results for input(s): NA, K, CL, CO2, GLUCOSE, BUN, CREATININE, CALCIUM, GFRNONAA, GFRAA in the last 8760 hours.  HEMOGLOBIN A1C No results found for: HGBA1C, MPG  External Labs: Collected: 11/08/2020 provided by PCP. TSH 0.95. Free T4 1.1.  IMPRESSION:    ICD-10-CM   1. Tachycardia  R00.0 EKG 12-Lead    LONG TERM MONITOR (3-14 DAYS)    2. Shortness of breath  R06.02 PCV ECHOCARDIOGRAM COMPLETE    3. Attention deficit hyperactivity disorder (ADHD)  F90.8     4. Depression  F32.A        RECOMMENDATIONS: Erin Pierce is a 30 y.o. female whose past medical history and cardiac risk factors include: ADHD, depression, history of COVID-19 infection, former smoker, chronic fatigue syndrome.   Tachycardia  May be physiologic secondary to stimulant use and/or associated with stressful situations. Denies any near-syncope or syncopal events. No identifiable reversible cause. TSH within normal limits. No history of anemia. 7-day extended Holter monitor to evaluate for dysrhythmias  Shortness of breath Most likely secondary to her underlying tachycardia. Overall euvolemic and not in congestive heart failure. Echocardiogram will be ordered to evaluate for structural heart disease and left ventricular systolic function.  Attention deficit hyperactivity disorder (ADHD) and depression Patient has been on Adderall since the age of 69 and subsequently the  doses have been uptitrated.  If there is no organic cause that is identified that could explain her tachycardia I have asked the patient to discuss possible other pharmacological therapy for her ADHD with her psychiatrist  FINAL MEDICATION LIST END OF ENCOUNTER: No orders of the defined types were placed in this encounter.   Medications Discontinued During This Encounter  Medication Reason   ALPRAZolam (XANAX) 0.5 MG tablet Error   clindamycin (CLINDAGEL) 1 % gel Error   Progesterone Micronized (PROGESTERONE PO) Error   valACYclovir (VALTREX) 1000 MG tablet Error   tazarotene (TAZORAC) 0.1 % gel Error     Current Outpatient Medications:    ACZONE 7.5 % GEL, Apply 1 application topically at bedtime. After application of Tazorac, Disp: , Rfl: 2   amphetamine-dextroamphetamine (ADDERALL XR) 20 MG 24 hr capsule, Take 1 capsule (20 mg total) by mouth every morning., Disp: 30 capsule, Rfl: 0   amphetamine-dextroamphetamine (ADDERALL) 10 MG tablet, Take 1 tablet (10 mg total) by mouth 2 (two) times daily as needed., Disp: 60 tablet, Rfl: 0   azelastine (ASTELIN) 0.1 % nasal spray, Place 2 sprays into both nostrils 2 (two) times daily. Use in each nostril as directed, Disp: 30 mL, Rfl: 5   buPROPion (WELLBUTRIN XL) 300 MG 24 hr tablet, Take 300 mg by mouth every morning., Disp: , Rfl:   Orders Placed This Encounter  Procedures   LONG TERM MONITOR (3-14 DAYS)   EKG 12-Lead   PCV ECHOCARDIOGRAM COMPLETE    There are no Patient Instructions on file for this visit.   --Continue cardiac medications as reconciled in final medication list. --Return in about 4 weeks (around 02/14/2021) for Follow up, Palpitations. Or sooner if needed. --Continue follow-up with your primary care physician regarding the management of your other chronic comorbid conditions.  Patient's questions and concerns were addressed to her satisfaction. She voices understanding of the instructions provided during this encounter.    This note was created using a voice recognition software as a result there may be grammatical errors inadvertently enclosed that do not reflect the nature of this encounter. Every attempt is made to correct such errors.  Tessa Lerner, [place], [facility]  Pager: [phone] Office: [phone]

## 2021-02-05 ENCOUNTER — Telehealth: Payer: Self-pay

## 2021-02-05 NOTE — Telephone Encounter (Signed)
Patient calling for monitor results. 

## 2021-02-09 NOTE — Telephone Encounter (Signed)
Yes

## 2021-02-09 NOTE — Telephone Encounter (Signed)
I believe you spoke to the patient regarding already, right.   ST

## 2021-02-15 ENCOUNTER — Other Ambulatory Visit: Payer: Self-pay

## 2021-02-15 ENCOUNTER — Ambulatory Visit: Payer: 59

## 2021-02-15 DIAGNOSIS — R0602 Shortness of breath: Secondary | ICD-10-CM

## 2021-02-27 ENCOUNTER — Encounter: Payer: Self-pay | Admitting: Cardiology

## 2021-02-27 ENCOUNTER — Ambulatory Visit: Payer: 59 | Admitting: Cardiology

## 2021-02-27 ENCOUNTER — Other Ambulatory Visit: Payer: Self-pay

## 2021-02-27 VITALS — BP 113/75 | HR 96 | Resp 16 | Ht 66.0 in | Wt 121.4 lb

## 2021-02-27 DIAGNOSIS — F908 Attention-deficit hyperactivity disorder, other type: Secondary | ICD-10-CM

## 2021-02-27 DIAGNOSIS — F32A Depression, unspecified: Secondary | ICD-10-CM

## 2021-02-27 DIAGNOSIS — R000 Tachycardia, unspecified: Secondary | ICD-10-CM

## 2021-02-27 NOTE — Progress Notes (Signed)
Date:  02/27/2021   ID:  Erin Pierce, DOB [birth year], MRN [id number]  PCP:  Eunice Blase, MD  Cardiologist:  Rex Kras, DO, [facility] (established care 01/17/2021)  Date: 02/27/21 Last Office Visit: 01/17/2021  Chief Complaint  Patient presents with   Follow-up   Palpitations   Results    HPI  Annaka Cosley is a 30 y.o. female who presents to the office with a chief complaint of " palpitations and discuss test results." Patient's past medical history and cardiovascular risk factors include: ADHD, depression, history of COVID-19 infection, former smoker, chronic fatigue syndrome.   She is referred to the office at the request of Dr. Cristie Hem for evaluation of tachycardia.  She is accompanied by her mother Suzi Roots at today's visit.  Patient provides verbal consent with regards to having her mother present during today's encounter.  And also requests PCP to be changed to Dr. Eunice Blase.  Last seen in consultation in October 2022.  Initially thought that her symptoms which are present throughout the entire day were due to caffeine and Adderall use.  She has tried to reduce the Adderall intake as well as caffeine but continues to have symptoms of palpitation throughout the day.  At the last office visit the shared decision was to proceed with an echocardiogram and an extended Holter monitor.  Results reviewed with her and her mother at today's encounter noted below for further reference.  Since last office visit patient states " absolutely no change."  She has been taking 10 mg of Adderall on a daily basis and 90 mg of caffeine.  Patient states that majority of her symptoms are probably stress related as she had a "personal event back in July 2022."  Denies any chest pain at rest or with effort related activities.  FUNCTIONAL STATUS: No structured exercise program or daily routine.   ALLERGIES: No Known Allergies  MEDICATION LIST PRIOR TO VISIT: Current Meds  Medication Sig    ACZONE 7.5 % GEL Apply 1 application topically at bedtime. After application of Tazorac   amphetamine-dextroamphetamine (ADDERALL XR) 20 MG 24 hr capsule Take 1 capsule (20 mg total) by mouth every morning.   amphetamine-dextroamphetamine (ADDERALL) 10 MG tablet Take 1 tablet (10 mg total) by mouth 2 (two) times daily as needed.   azelastine (ASTELIN) 0.1 % nasal spray Place 2 sprays into both nostrils 2 (two) times daily. Use in each nostril as directed   buPROPion (WELLBUTRIN XL) 300 MG 24 hr tablet Take 300 mg by mouth every morning.     PAST MEDICAL HISTORY: Past Medical History:  Diagnosis Date   Adductor tendinitis dx 2018   bilateral goin pain--- hx PRP treatment's 2019 @ [facility]   ADHD (attention deficit hyperactivity disorder)    Bilateral groin pain    Chronic insomnia    Chronic pain syndrome    secondary to trauma sport injury   Chronic pain syndrome    Fatigue    H/O cold sores    Plantar fasciitis of left foot    Seasonal affective disorder ([place]) 2013    PAST SURGICAL HISTORY: Past Surgical History:  Procedure Laterality Date   LAPAROSCOPIC INGUINAL HERNIA REPAIR Bilateral 06-02-2015  in [place], [place]   w/ mesh   LAPAROSCOPIC TUBAL LIGATION Bilateral 10/19/2018   Procedure: LAPAROSCOPIC TUBAL LIGATION;  Surgeon: Arvella Nigh, MD;  Location: [facility];  Service: Gynecology;  Laterality: Bilateral;    FAMILY HISTORY: The patient family history includes Asthma in her maternal  grandmother; Diabetes in her maternal grandmother; Stroke in her maternal grandmother.  SOCIAL HISTORY:  The patient  reports that she quit smoking about 9 years ago. Her smoking use included cigarettes. She has a 2.50 pack-year smoking history. She has never used smokeless tobacco. She reports current alcohol use. She reports that she does not use drugs.  REVIEW OF SYSTEMS: Review of Systems  Constitutional: Negative for chills and fever.  HENT:  Negative for hoarse voice,  nosebleeds and tinnitus.   Eyes:  Negative for discharge, double vision and pain.  Cardiovascular:  Positive for palpitations (tachycarida). Negative for chest pain, claudication, dyspnea on exertion, leg swelling, near-syncope, orthopnea, paroxysmal nocturnal dyspnea and syncope.  Respiratory:  Negative for hemoptysis and shortness of breath.   Musculoskeletal:  Negative for muscle cramps and myalgias.  Gastrointestinal:  Negative for abdominal pain, constipation, diarrhea, hematemesis, hematochezia, melena, nausea and vomiting.  Neurological:  Negative for dizziness and light-headedness.   PHYSICAL EXAM: Vitals with BMI 02/27/2021 01/17/2021 07/19/2019  Height 5\' 6"  5\' 6"  5\' 6"   Weight 121 lbs 6 oz 117 lbs 115 lbs  BMI 19.6 18.89 18.57  Systolic 113 109 -  Diastolic 75 72 -  Pulse 96 112 -    CONSTITUTIONAL: Well-developed and well-nourished. No acute distress.  SKIN: Skin is warm and dry. No rash noted. No cyanosis. No pallor. No jaundice HEAD: Normocephalic and atraumatic.  EYES: No scleral icterus MOUTH/THROAT: Moist oral membranes.  NECK: No JVD present. No thyromegaly noted. No carotid bruits  LYMPHATIC: No visible cervical adenopathy.  CHEST Normal respiratory effort. No intercostal retractions  LUNGS: Clear to auscultation bilaterally.  No stridor. No wheezes. No rales.  CARDIOVASCULAR: Tachycardia, regular, positive S1-S2, no murmurs rubs or gallops appreciated secondary to tachycardia. ABDOMINAL: Soft, nontender, nondistended, positive bowel sounds in all 4 quadrants, no apparent ascites.  EXTREMITIES: No peripheral edema  HEMATOLOGIC: No significant bruising NEUROLOGIC: Oriented to person, place, and time. Nonfocal. Normal muscle tone.  PSYCHIATRIC: Normal mood and affect. Normal behavior. Cooperative  CARDIAC DATABASE: EKG: 01/17/2021: Normal sinus rhythm, 94 bpm, normal axis, without underlying ischemia or injury pattern.   Echocardiogram: 04/17/2020: Normal LV  systolic function with visual EF 60-65%. Left ventricle cavity is normal in size. Normal left ventricular wall thickness. Normal global wall motion. Normal diastolic filling pattern, normal LAP. No significant valvular heart disease. No prior study for comparison.   Stress Testing: No results found for this or any previous visit from the past 1095 days.  Heart Catheterization: None  7 day extended Holter monitor: Dominant rhythm normal sinus, followed by sinus tachycardia (burden 43%). Heart rate 62-179 bpm. Avg HR 99 bpm. No atrial fibrillation, supraventricular or ventricular tachycardia, high grade AV block, pauses (3 seconds or longer). Total ventricular ectopic burden <1%. Total supraventricular ectopic burden <1%. Patient triggered events: 3. Underlying rhythm sinus tachycardia without dysrhythmias.   LABORATORY DATA: CBC Latest Ref Rng & Units 03/26/2019 10/15/2018  WBC 4.0 - 10.5 K/uL 4.9 4.6  Hemoglobin 12.0 - 15.0 g/dL 06/15/2020 14/02/2019  Hematocrit 12/16/2018 - 46.0 % 42.0 40.5  Platelets 150 - 400 K/uL 217 172    No flowsheet data found.  Lipid Panel  No results found for: CHOL, TRIG, HDL, CHOLHDL, VLDL, LDLCALC, LDLDIRECT, LABVLDL  No components found for: NTPROBNP No results for input(s): PROBNP in the last 8760 hours. No results for input(s): TSH in the last 8760 hours.  BMP No results for input(s): NA, K, CL, CO2, GLUCOSE, BUN, CREATININE, CALCIUM, GFRNONAA, GFRAA in  the last 8760 hours.  HEMOGLOBIN A1C No results found for: HGBA1C, MPG  External Labs: Collected: 11/08/2020 provided by PCP. TSH 0.95. Free T4 1.1.  IMPRESSION:    ICD-10-CM   1. Tachycardia  R00.0     2. Attention deficit hyperactivity disorder (ADHD)  F90.8     3. Depression  F32.A         RECOMMENDATIONS: Wiletta Columbia is a 30 y.o. female whose past medical history and cardiac risk factors include: ADHD, depression, history of COVID-19 infection, former smoker, chronic fatigue syndrome.    Patient initially presented to the office for evaluation of tachycardia.  I suspect is most likely secondary to multifactorial etiologies such as caffeine use, Adderall use, stress, etc.  She has undergone echocardiogram which notes preserved LVEF, no structural heart disease.  Extended Holter monitor notes no significant dysrhythmias and her average heart rate is around 99 bpm.  TSH within normal limits.  She is reestablishing care with Dr. Junius Roads will have repeat blood work.  Recommended having her electrolytes checked as well as hemoglobin and hematocrit.  Patient voices that majority of her symptoms are due to underlying stress.  I have asked her to engage into activities that will help reduce her stress and importance of stress management.  She may benefit from seeing either psychiatry or psychologist given her stress, anxiety, depression, and ADHD if deemed appropriate by her primary care provider.   Would not recommend any additional cardiovascular testing at this time.  Advocated addressing the root cause instead of temporary measures with pharmacotherapy.  However, if her symptoms of palpitations continue despite such interventions may consider either beta-blockers or calcium channel blockers for symptom management under the guidance of medical provider.    I will see the patient on as needed basis or if change in clinical status.  Patient and her mother are agreeable with the plan of care.  FINAL MEDICATION LIST END OF ENCOUNTER: No orders of the defined types were placed in this encounter.   There are no discontinued medications.    Current Outpatient Medications:    ACZONE 7.5 % GEL, Apply 1 application topically at bedtime. After application of Tazorac, Disp: , Rfl: 2   amphetamine-dextroamphetamine (ADDERALL XR) 20 MG 24 hr capsule, Take 1 capsule (20 mg total) by mouth every morning., Disp: 30 capsule, Rfl: 0   amphetamine-dextroamphetamine (ADDERALL) 10 MG tablet, Take 1 tablet  (10 mg total) by mouth 2 (two) times daily as needed., Disp: 60 tablet, Rfl: 0   azelastine (ASTELIN) 0.1 % nasal spray, Place 2 sprays into both nostrils 2 (two) times daily. Use in each nostril as directed, Disp: 30 mL, Rfl: 5   buPROPion (WELLBUTRIN XL) 300 MG 24 hr tablet, Take 300 mg by mouth every morning., Disp: , Rfl:   No orders of the defined types were placed in this encounter.   There are no Patient Instructions on file for this visit.   --Continue cardiac medications as reconciled in final medication list. --Return if symptoms worsen or fail to improve. Or sooner if needed. --Continue follow-up with your primary care physician regarding the management of your other chronic comorbid conditions.  Patient's questions and concerns were addressed to her satisfaction. She voices understanding of the instructions provided during this encounter.   This note was created using a voice recognition software as a result there may be grammatical errors inadvertently enclosed that do not reflect the nature of this encounter. Every attempt is made to correct such errors.  Rex Kras, [place], [facility]  Pager: [phone] Office: [phone]

## 2021-06-15 IMAGING — US US BREAST*L* LIMITED INC AXILLA
1 series · 4 of 4 positions shown · non-contrast
Comparison: None.

CLINICAL DATA: 29-year-old presenting with a possible palpable lump
in the UPPER INNER LEFT breast which she initially noted
approximately 1 week ago. This is the patient's initial baseline
mammogram. She has BILATERAL retropectoral saline implants.

Patient states no family history of breast cancer.
EXAM:
DIGITAL DIAGNOSTIC BILATERAL MAMMOGRAM WITH IMPLANTS, CAD AND TOMO
ULTRASOUND LEFT BREAST

[Series 1: us breast*left* limited inc axilla · 0.05mm/px · 4 of 4 slices shown]
[im 1/4]
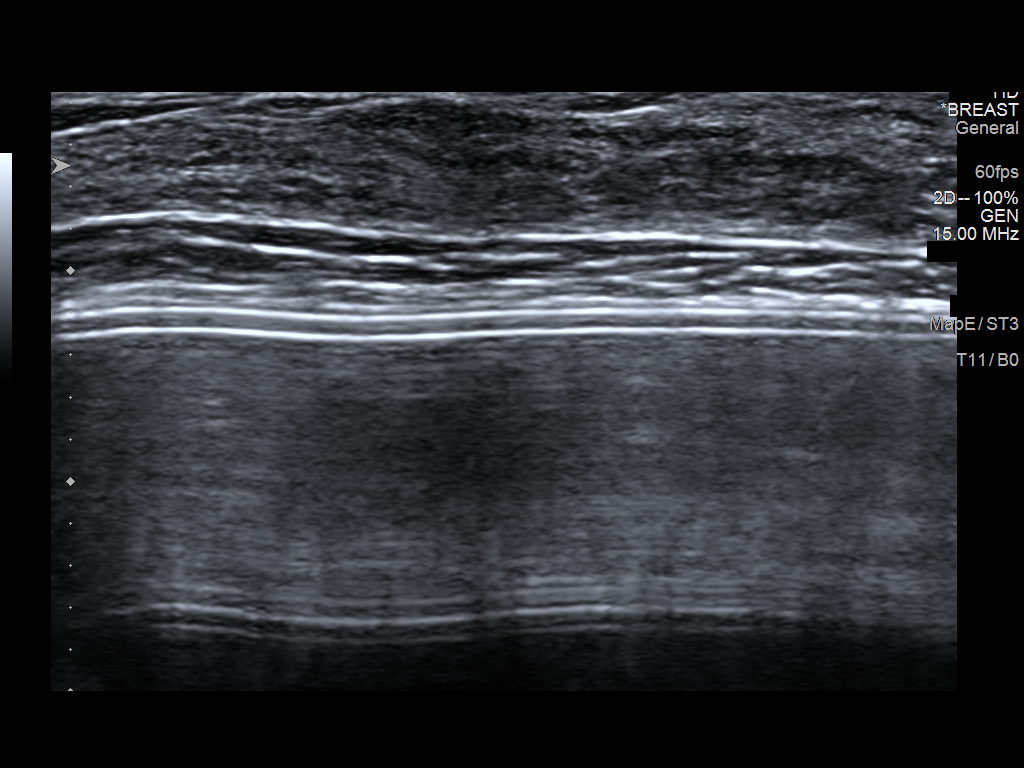
[im 2/4]
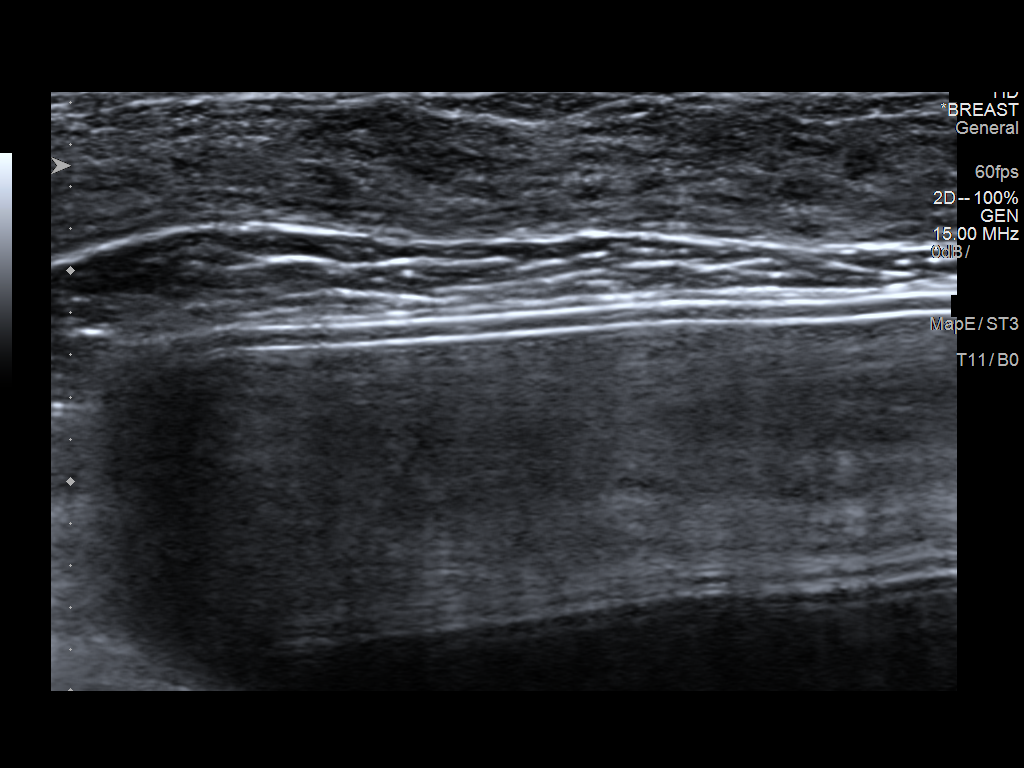
[im 3/4]
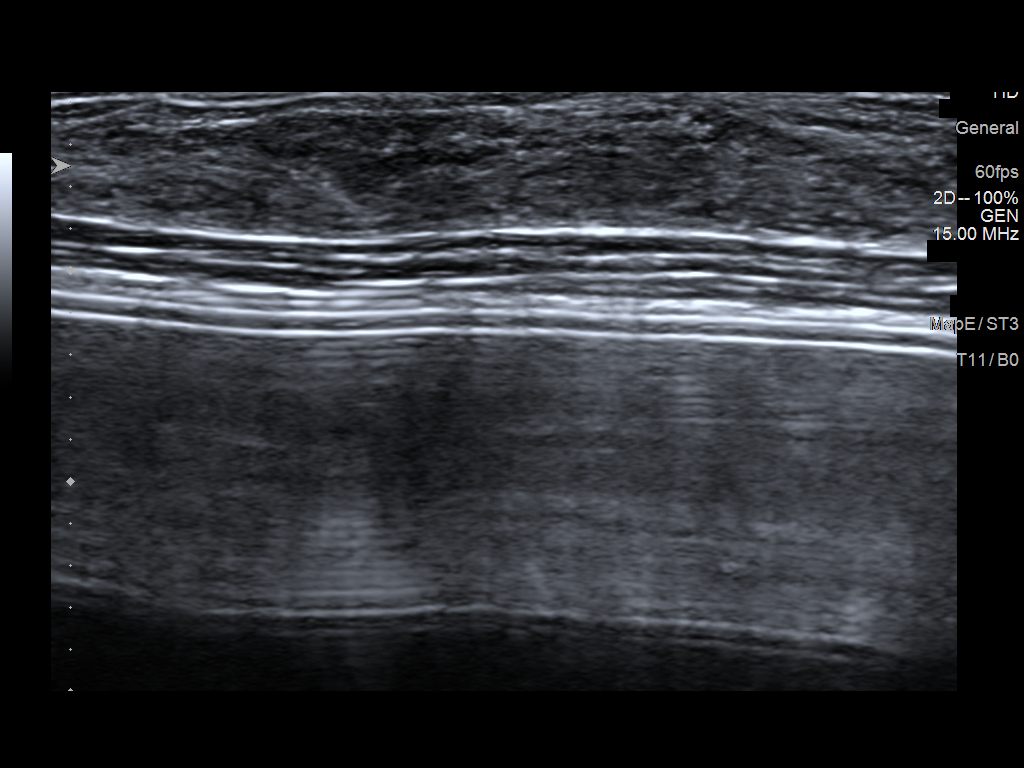
[im 4/4]
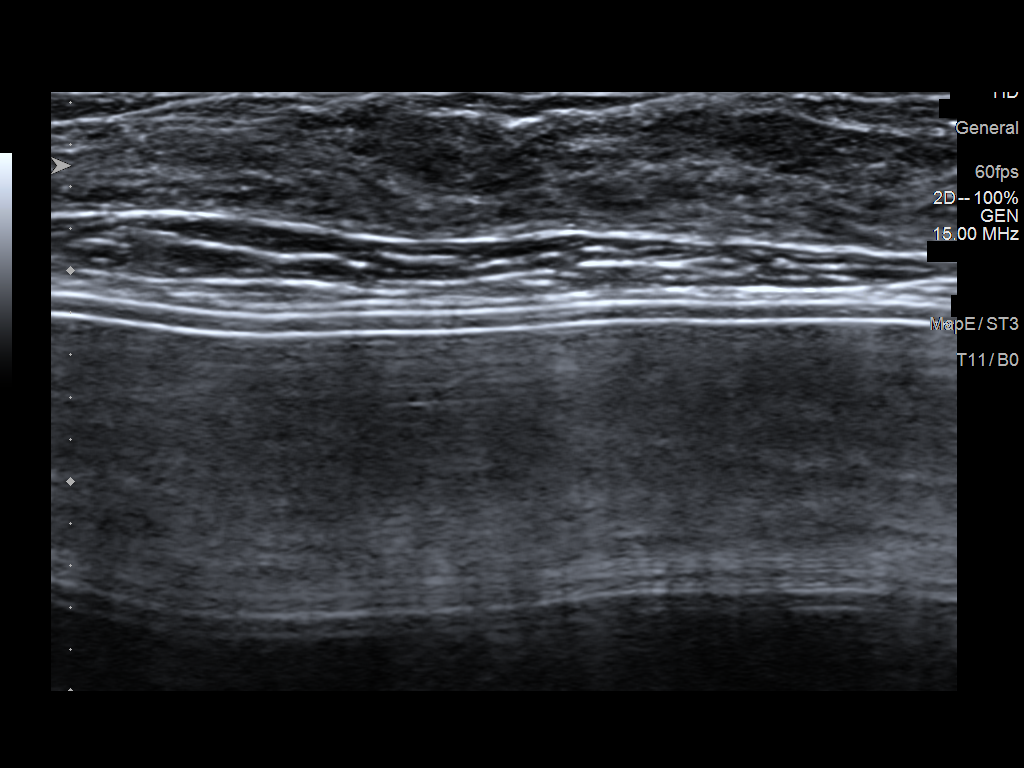

[4 of 4 positions shown; findings below may reference images not displayed]

ACR Breast Density Category d: The breast tissue is extremely dense,
which lowers the sensitivity of mammography.
FINDINGS: The patient has retropectoral saline implants. Standard 2D full
field CC and MLO views of both breasts and tomosynthesis and
synthesized implant displaced CC and MLO views of both breasts were
obtained. Tomosynthesis and synthesized spot compression tangential
view of the area of concern in the LEFT breast was also obtained.

No mammographic abnormality in the area of palpable concern in the
UPPER INNER LEFT breast. Normal dense fibroglandular tissue extends
very close to the skin surface at this location.

No findings suspicious for malignancy in either breast.

Mammographic images were processed with CAD.

On correlative physical exam, there is a palpable ridge of tissue in
the UPPER INNER QUADRANT of the LEFT breast corresponding to what
the patient is feeling, though I do not palpate a discrete mass.

Targeted LEFT breast ultrasound is performed in the area of palpable
concern, showing normal dense fibroglandular tissue at the 10
o'clock position approximately 4 cm from the nipple. No cyst, solid
mass or abnormal acoustic shadowing is identified.
IMPRESSION: 1. No mammographic or sonographic evidence of malignancy involving
the LEFT breast.
2. No mammographic evidence of malignancy involving the RIGHT
breast.

RECOMMENDATION:
Screening mammogram at age 38 unless there are persistent or
interval clinical concerns. (Code:P5-8-PLW)

I have discussed the findings and recommendations with the patient.
If applicable, a reminder letter will be sent to the patient
regarding the next appointment.

BI-RADS CATEGORY  1: Negative.

## 2021-06-15 IMAGING — MG MM  DIGITAL DIAGNOSTIC BREAST BILAT IMPLANT W/ TOMO W/ CAD
8 of 14 series · 8 of 34 positions shown · non-contrast
Comparison: None.

CLINICAL DATA: 29-year-old presenting with a possible palpable lump
in the UPPER INNER LEFT breast which she initially noted
approximately 1 week ago. This is the patient's initial baseline
mammogram. She has BILATERAL retropectoral saline implants.

Patient states no family history of breast cancer.
EXAM:
DIGITAL DIAGNOSTIC BILATERAL MAMMOGRAM WITH IMPLANTS, CAD AND TOMO
ULTRASOUND LEFT BREAST

[R CC]
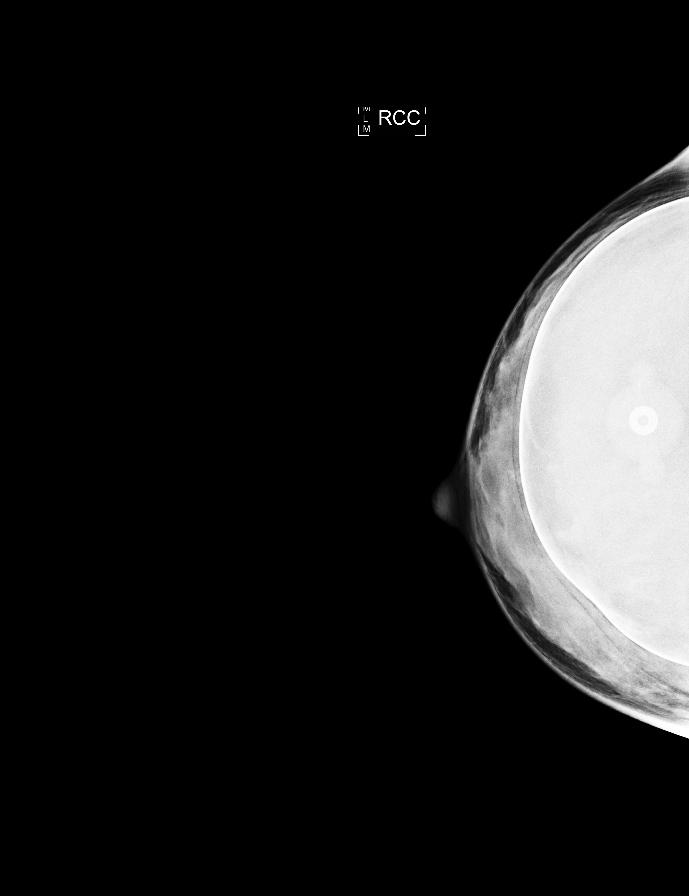

[L CC]
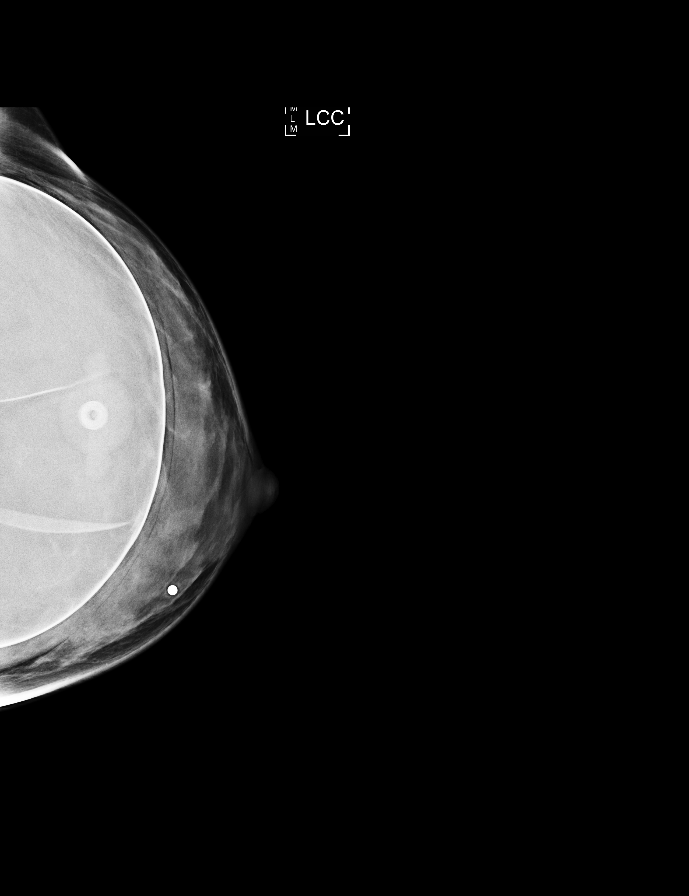

[L MLO]
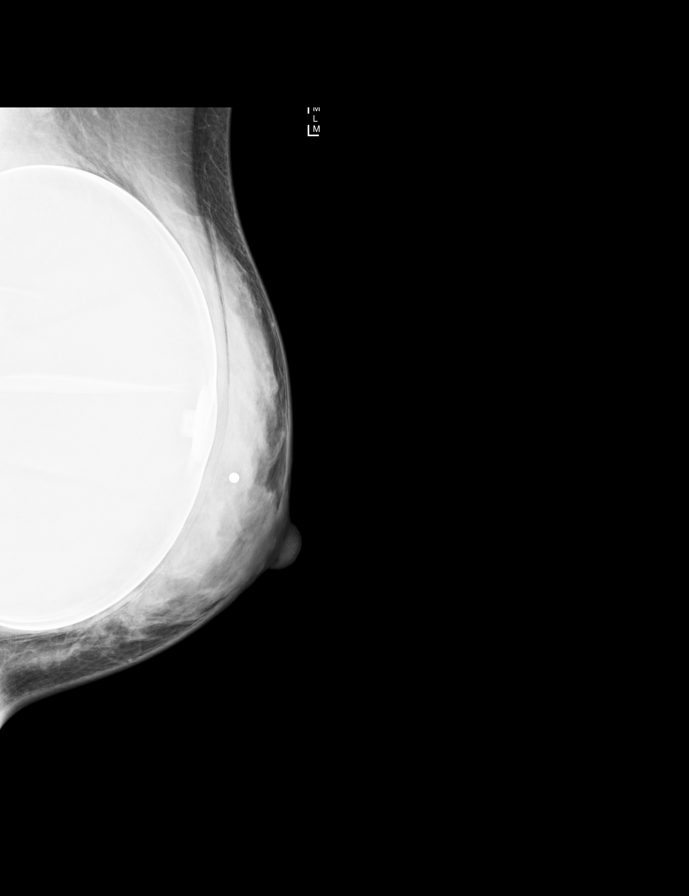

[R MLO]
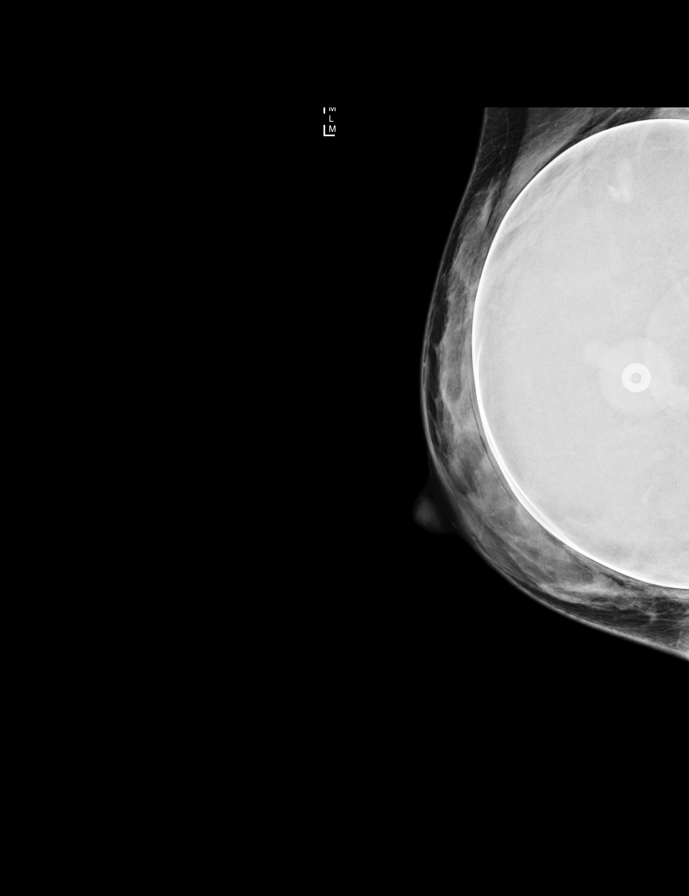

[R MLO synth-2D]
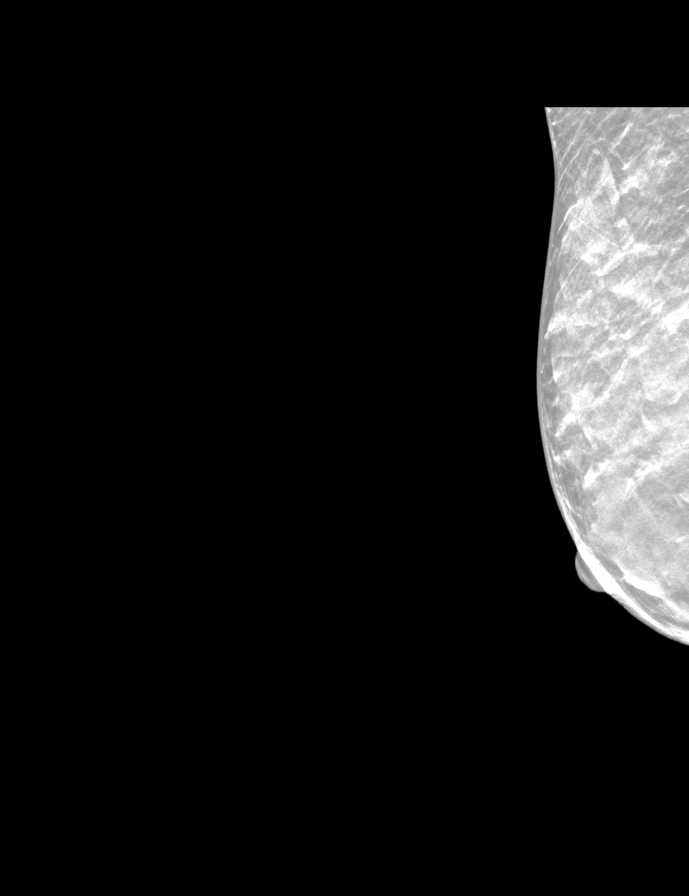

[R CC synth-2D]
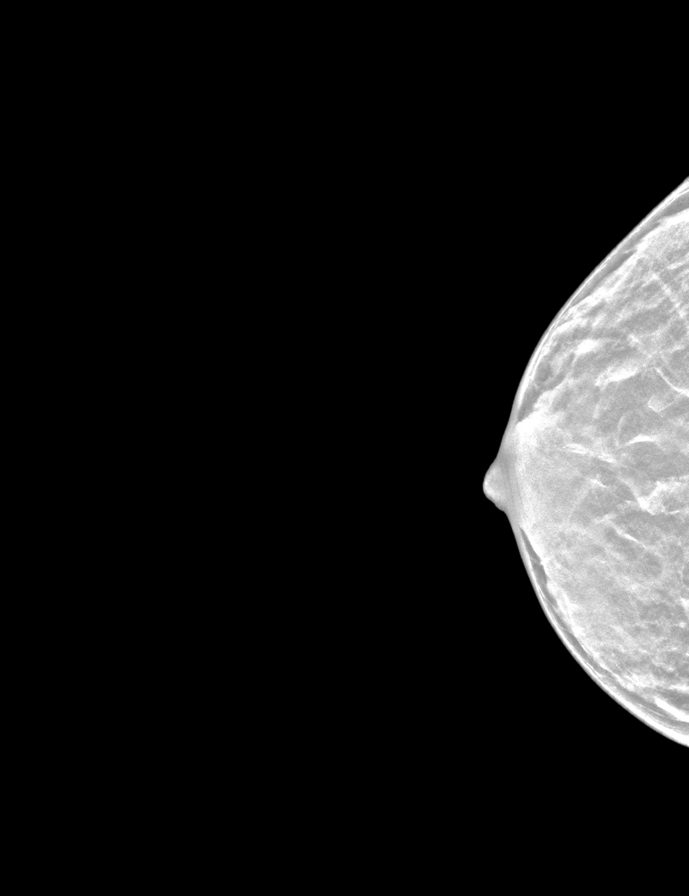

[L MLO synth-2D]
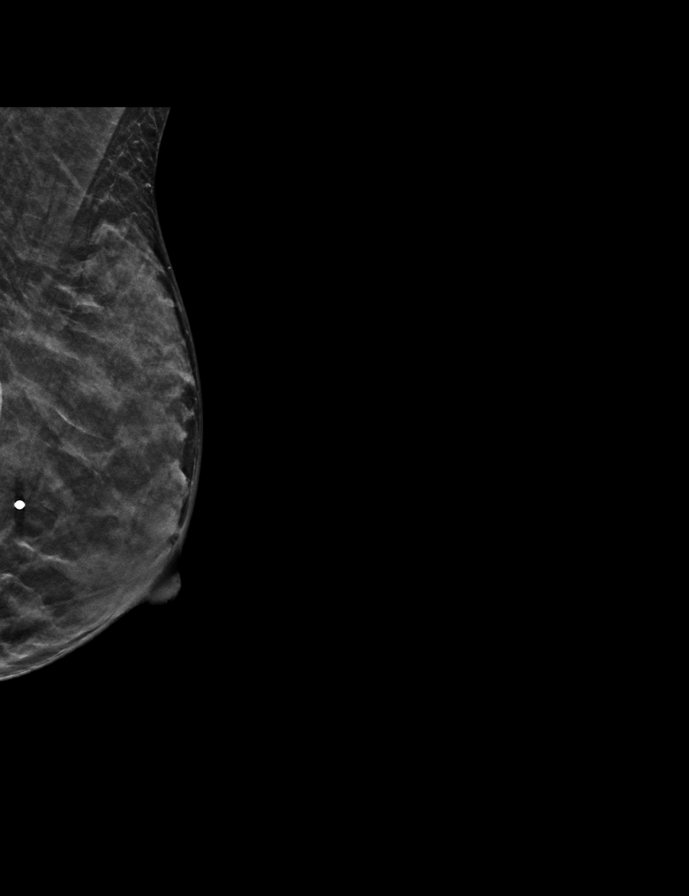

[L CC synth-2D]
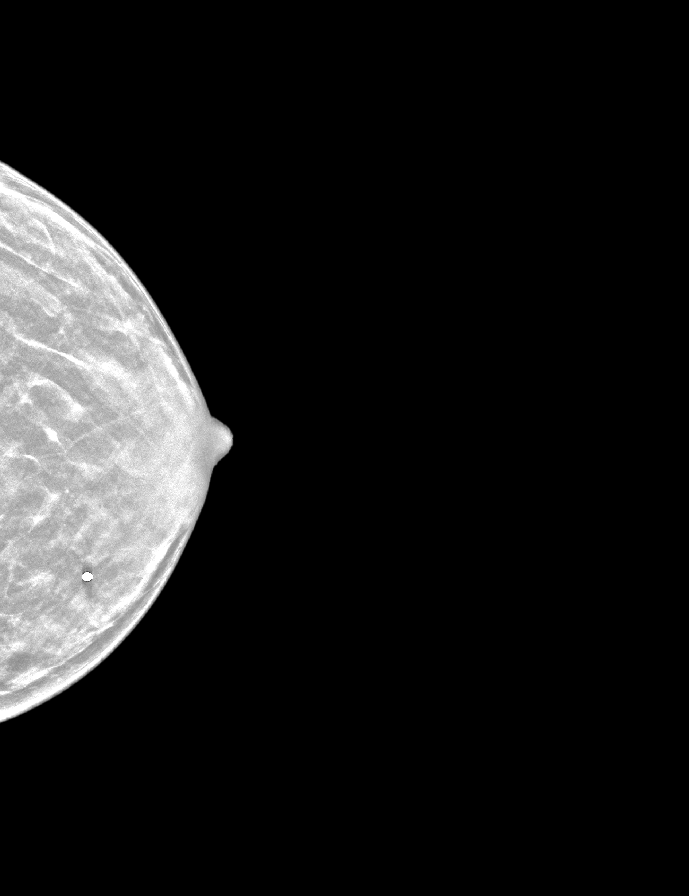

[8 of 34 positions shown; findings below may reference images not displayed]

ACR Breast Density Category d: The breast tissue is extremely dense,
which lowers the sensitivity of mammography.
FINDINGS: The patient has retropectoral saline implants. Standard 2D full
field CC and MLO views of both breasts and tomosynthesis and
synthesized implant displaced CC and MLO views of both breasts were
obtained. Tomosynthesis and synthesized spot compression tangential
view of the area of concern in the LEFT breast was also obtained.

No mammographic abnormality in the area of palpable concern in the
UPPER INNER LEFT breast. Normal dense fibroglandular tissue extends
very close to the skin surface at this location.

No findings suspicious for malignancy in either breast.

Mammographic images were processed with CAD.

On correlative physical exam, there is a palpable ridge of tissue in
the UPPER INNER QUADRANT of the LEFT breast corresponding to what
the patient is feeling, though I do not palpate a discrete mass.

Targeted LEFT breast ultrasound is performed in the area of palpable
concern, showing normal dense fibroglandular tissue at the 10
o'clock position approximately 4 cm from the nipple. No cyst, solid
mass or abnormal acoustic shadowing is identified.
IMPRESSION: 1. No mammographic or sonographic evidence of malignancy involving
the LEFT breast.
2. No mammographic evidence of malignancy involving the RIGHT
breast.

RECOMMENDATION:
Screening mammogram at age 38 unless there are persistent or
interval clinical concerns. (Code:P5-8-PLW)

I have discussed the findings and recommendations with the patient.
If applicable, a reminder letter will be sent to the patient
regarding the next appointment.

BI-RADS CATEGORY  1: Negative.

## 2021-10-20 ENCOUNTER — Encounter (HOSPITAL_COMMUNITY): Payer: Self-pay | Admitting: Psychiatry

## 2021-10-20 ENCOUNTER — Ambulatory Visit (HOSPITAL_BASED_OUTPATIENT_CLINIC_OR_DEPARTMENT_OTHER): Payer: 59 | Admitting: Psychiatry

## 2021-10-20 DIAGNOSIS — F329 Major depressive disorder, single episode, unspecified: Secondary | ICD-10-CM | POA: Diagnosis not present

## 2021-10-20 DIAGNOSIS — F902 Attention-deficit hyperactivity disorder, combined type: Secondary | ICD-10-CM

## 2021-10-20 DIAGNOSIS — F411 Generalized anxiety disorder: Secondary | ICD-10-CM

## 2021-10-20 MED ORDER — BUPROPION HCL ER (XL) 150 MG PO TB24: 150.0000 mg | ORAL_TABLET | ORAL | 2 refills | Status: DC

## 2021-10-20 MED ORDER — DIAZEPAM 5 MG PO TABS: 5.0000 mg | ORAL_TABLET | Freq: Two times a day (BID) | ORAL | 2 refills | Status: DC | PRN

## 2021-10-20 MED ORDER — LISDEXAMFETAMINE DIMESYLATE 40 MG PO CAPS: 40.0000 mg | ORAL_CAPSULE | ORAL | 0 refills | Status: DC

## 2021-10-20 NOTE — Progress Notes (Signed)
Virtual Visit via Video Note  I connected with Erin Pierce on 10/20/21 at 10:00 AM EDT by a video enabled telemedicine application and verified that I am speaking with the correct person using two identifiers.  Location: Patient: home Provider: office   I discussed the limitations of evaluation and management by telemedicine and the availability of in person appointments. The patient expressed understanding and agreed to proceed.      I discussed the assessment and treatment plan with the patient. The patient was provided an opportunity to ask questions and all were answered. The patient agreed with the plan and demonstrated an understanding of the instructions.   The patient was advised to call back or seek an in-person evaluation if the symptoms worsen or if the condition fails to improve as anticipated.  I provided 60 minutes of non-face-to-face time during this encounter.   [name], MD   Psychiatric Initial Adult Assessment   Patient Identification: Erin Pierce MRN:  [id number] Date of Evaluation:  10/20/2021 Referral Source: Dr. Lavada Mesi Chief Complaint:   Chief Complaint  Patient presents with   ADD   Depression   Anxiety   Follow-up   Visit Diagnosis:    ICD-10-CM   1. Attention deficit hyperactivity disorder (ADHD), combined type  F90.2     2. Generalized anxiety disorder  F41.1     3. Reactive depression  F32.9       History of Present Illness: This patient is a 31 year old white female who is living with a boyfriend in [place].  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  The patient was referred by her PCP [name] for further evaluation of ADD anxiety depression and chronic fatigue.  The patient states that she has had significant health issues that date back to 2014.  She states at that time she was having a lot of groin pain and was diagnosed with with bilateral hernias.  She developed tendinopathy  after surgeries for the hernias.  She has also had less than planter fasciitis and left knee pain.  Since then she still in a fair amount of pain but has also developed severe chronic fatigue and "brain fog."  This is also been accompanied by significant anxiety tachycardia and absolutely no energy to do anything.  She also has difficulty with sleeping.  The patient has tried numerous treatment modalities.  She was diagnosed with ADD at age 41 and has been on Adderall at varying doses ever since.  It helps to some degree.  She states that the anxiety she has can be debilitating.  She was recently prescribed Valium 5 mg which is "help more than anything else."  She still uses acupuncture but has tried numerous supplements and treatment modalities such as dry needling and massage.  She went to the [facility] clinic which she uses functional brain scanning to make diagnoses and psychiatry which has not been endorsed by the APA.  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  She is on Wellbutrin but is gradually decreasing the dose as that she does not feel it is helped that much.  At present she is somewhat depressed but primarily anxious.  She has difficulty sleeping although the Valium has helped.  She is only been on it about a week.  She takes Adderall XR 20 mg in the morning and sometimes supplements it was 10 or 20 more milligrams of regular Adderall through  the day.  She states that she is "barely hanging on" at work because of difficulty focus and severe anxiety.  I suggested that we use a longer acting stimulant to help with the focus and she is agreeable.  The Valium is helped so this should be continued for now.  Rather than change everything it well so I suggested we look at the antidepressant and how it is working at the next visit.  Associated Signs/Symptoms: Depression Symptoms:  depressed mood, psychomotor retardation, fatigue, difficulty  concentrating, anxiety, weight loss, (Hypo) Manic Symptoms:   Anxiety Symptoms:  Excessive Worry, Psychotic Symptoms:   PTSD Symptoms:   Past Psychiatric History: Patient saw psychiatrist in [place] 7 years ago for treatment with Adderall for ADHD.  She was seen by psychiatrist at the [facility] clinic.  She has been looking for a therapist and has been in therapy on and off since high school.  Previous Psychotropic Medications: Yes   Substance Abuse History in the last 12 months:  No.  Consequences of Substance Abuse: Negative  Past Medical History:  Past Medical History:  Diagnosis Date   Adductor tendinitis dx 2018   bilateral goin pain--- hx PRP treatment's 2019 @ [facility]   ADHD (attention deficit hyperactivity disorder)    Bilateral groin pain    Chronic insomnia    Chronic pain syndrome    secondary to trauma sport injury   Chronic pain syndrome    Fatigue    H/O cold sores    Plantar fasciitis of left foot    Seasonal affective disorder (HCC) 2013    Past Surgical History:  Procedure Laterality Date   LAPAROSCOPIC INGUINAL HERNIA REPAIR Bilateral 06-02-2015  in [place], [place]   w/ mesh   LAPAROSCOPIC TUBAL LIGATION Bilateral 10/19/2018   Procedure: LAPAROSCOPIC TUBAL LIGATION;  Surgeon: Richardean Chimera, MD;  Location: [facility];  Service: Gynecology;  Laterality: Bilateral;    Family Psychiatric History: Mother has a history of ADD.  The father has a history of anxiety  Family History:  Family History  Problem Relation Age of Onset   ADD / ADHD Mother    Anxiety disorder Father    Asthma Maternal Grandmother    Stroke Maternal Grandmother    Diabetes Maternal Grandmother     Social History:   Social History   Socioeconomic History   Marital status: Single    Spouse name: Not on file   Number of children: 0   Years of education: Not on file   Highest education level: Not on file  Occupational History    Employer: [employer]   Tobacco Use   Smoking status: Former    Packs/day: 0.50    Years: 5.00    Total pack years: 2.50    Types: Cigarettes    Quit date: 10/14/2011    Years since quitting: 10.0   Smokeless tobacco: Never  Vaping Use   Vaping Use: Never used  Substance and Sexual Activity   Alcohol use: Yes    Comment: 2-3 per day   Drug use: Not Currently    Types: Marijuana   Sexual activity: Yes    Birth control/protection: Surgical  Other Topics Concern   Not on file  Social History Narrative   Not on file   Social Determinants of Health   Financial Resource Strain: Not on file  Food Insecurity: Not on file  Transportation Needs: Not on file  Physical Activity: Not on file  Stress: Not on file  Social  Connections: Not on file    Additional Social History: The patient grew up in [place].  Her mother was a single parent.  She describes her father is very difficult to connect with as a "devout Jehovah witness."  She has no siblings.  She is currently living with a boyfriend and for the most part the relationship is going well.  She has a degree in accounting.  Allergies:  No Known Allergies  Metabolic Disorder Labs: No results found for: "HGBA1C", "MPG" No results found for: "PROLACTIN" No results found for: "CHOL", "TRIG", "HDL", "CHOLHDL", "VLDL", "LDLCALC" Lab Results  Component Value Date   TSH 1.314 03/26/2019    Therapeutic Level Labs: No results found for: "LITHIUM" No results found for: "CBMZ" No results found for: "VALPROATE"  Current Medications: Current Outpatient Medications  Medication Sig Dispense Refill   buPROPion (WELLBUTRIN XL) 150 MG 24 hr tablet Take 1 tablet (150 mg total) by mouth every morning. 30 tablet 2   lisdexamfetamine (VYVANSE) 40 MG capsule Take 1 capsule (40 mg total) by mouth every morning. 30 capsule 0   ACZONE 7.5 % GEL Apply 1 application topically at bedtime. After application of Tazorac  2   azelastine (ASTELIN) 0.1 % nasal spray Place 2  sprays into both nostrils 2 (two) times daily. Use in each nostril as directed 30 mL 5   buPROPion (WELLBUTRIN XL) 300 MG 24 hr tablet Take 300 mg by mouth every morning.     diazepam (VALIUM) 5 MG tablet Take 1 tablet (5 mg total) by mouth 2 (two) times daily as needed for anxiety. 60 tablet 2   No current facility-administered medications for this visit.    Musculoskeletal: Strength & Muscle Tone: within normal limits Gait & Station: normal Patient leans: N/A  Psychiatric Specialty Exam: Review of Systems  Constitutional:  Positive for fatigue.  Musculoskeletal:  Positive for myalgias.  Psychiatric/Behavioral:  Positive for decreased concentration, dysphoric mood and sleep disturbance. The patient is nervous/anxious.   All other systems reviewed and are negative.   There were no vitals taken for this visit.There is no height or weight on file to calculate BMI.  General Appearance: Casual and Fairly Groomed  Eye Contact:  Good  Speech:  Clear and Coherent  Volume:  Normal  Mood:  Anxious and Dysphoric  Affect:  Congruent  Thought Process:  Goal Directed  Orientation:  Full (Time, Place, and Person)  Thought Content:  Rumination  Suicidal Thoughts:  No  Homicidal Thoughts:  No  Memory:  Immediate;   Good Recent;   Good Remote;   Good  Judgement:  Good  Insight:  Fair  Psychomotor Activity:  Decreased  Concentration:  Concentration: Fair and Attention Span: Fair  Recall:  Good  Fund of Knowledge:Good  Language: Good  Akathisia:  No  Handed:  Right  AIMS (if indicated):  not done  Assets:  Communication Skills Desire for Improvement Resilience Social Support Talents/Skills Vocational/Educational  ADL's:  Intact  Cognition: WNL  Sleep:  Fair   Screenings: GAD-7    Flowsheet Row Office Visit from 01/15/2018 in Primary Care at [facility]  Total GAD-7 Score 1      PHQ2-9    Flowsheet Row [facility] Office Visit from 02/19/2018 in Primary Care at [facility] Visit from 01/15/2018 in Primary Care at [facility] Visit from 11/24/2017 in Primary Care at [facility] Total Score 6 2 3 2   PHQ-9 Total Score  19 -- 14 12       Assessment and Plan: This patient is a 31 year old white female with a history of ADD chronic fatigue chronic pain some depression and significant anxiety.  She does think the Valium has helped more than anything else so we will continue the 5 mg daily she asked if she can increase this at the weekends to 10 mg and I think this is reasonable.  She will also for now continue Wellbutrin XL 150 mg daily.  We will look at a different antidepressant in the future.  I think by now the effectiveness of Adderall has worn off and we will switch to Vyvanse 40 mg every morning which should last more throughout her whole workday and help her stay focused.  She will return to see me in 4 weeks  Collaboration of Care: Primary Care Provider AEB notes are shared with HCP on the epic system  Patient/Guardian was advised Release of Information must be obtained prior to any record release in order to collaborate their care with an outside provider. Patient/Guardian was advised if they have not already done so to contact the registration department to sign all necessary forms in order for [place] to release information regarding their care.   Consent: Patient/Guardian gives verbal consent for treatment and assignment of benefits for services provided during this visit. Patient/Guardian expressed understanding and agreed to proceed.   [name], MD 7/8/202310:48 AM

## 2021-11-16 ENCOUNTER — Encounter (HOSPITAL_COMMUNITY): Payer: Self-pay | Admitting: Psychiatry

## 2021-11-16 ENCOUNTER — Telehealth (INDEPENDENT_AMBULATORY_CARE_PROVIDER_SITE_OTHER): Payer: 59 | Admitting: Psychiatry

## 2021-11-16 DIAGNOSIS — F39 Unspecified mood [affective] disorder: Secondary | ICD-10-CM | POA: Diagnosis not present

## 2021-11-16 DIAGNOSIS — F902 Attention-deficit hyperactivity disorder, combined type: Secondary | ICD-10-CM

## 2021-11-16 DIAGNOSIS — F411 Generalized anxiety disorder: Secondary | ICD-10-CM

## 2021-11-16 MED ORDER — DIAZEPAM 5 MG PO TABS: 5.0000 mg | ORAL_TABLET | Freq: Two times a day (BID) | ORAL | 2 refills | Status: DC | PRN

## 2021-11-16 MED ORDER — BUPROPION HCL ER (XL) 300 MG PO TB24: 300.0000 mg | ORAL_TABLET | Freq: Every morning | ORAL | 2 refills | Status: DC

## 2021-11-16 MED ORDER — LISDEXAMFETAMINE DIMESYLATE 60 MG PO CAPS: 60.0000 mg | ORAL_CAPSULE | ORAL | 0 refills | Status: DC

## 2021-11-16 MED ORDER — BUPROPION HCL ER (XL) 150 MG PO TB24: 150.0000 mg | ORAL_TABLET | ORAL | 2 refills | Status: DC

## 2021-11-16 NOTE — Progress Notes (Signed)
Virtual Visit via Video Note  I connected with Erin Pierce on 11/16/21 at 11:20 AM EDT by a video enabled telemedicine application and verified that I am speaking with the correct person using two identifiers.  Location: Patient: home Provider: office   I discussed the limitations of evaluation and management by telemedicine and the availability of in person appointments. The patient expressed understanding and agreed to proceed.      I discussed the assessment and treatment plan with the patient. The patient was provided an opportunity to ask questions and all were answered. The patient agreed with the plan and demonstrated an understanding of the instructions.   The patient was advised to call back or seek an in-person evaluation if the symptoms worsen or if the condition fails to improve as anticipated.  I provided 30 minutes of non-face-to-face time during this encounter.   [name], MD  [facility] MD/PA/NP OP Progress Note  11/16/2021 11:59 AM Erin Pierce  MRN:  [id number]  Chief Complaint:  Chief Complaint  Patient presents with   Anxiety   Depression   ADD   Follow-up   HPI: This patient is a 31 year old white female who is living with a boyfriend in [place].  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  The patient was referred by her PCP [name] for further evaluation of ADD anxiety depression and chronic fatigue.  The patient states that she has had significant health issues that date back to 2014.  She states at that time she was having a lot of groin pain and was diagnosed with with bilateral hernias.  She developed tendinopathy after surgeries for the hernias.  She has also had less than planter fasciitis and left knee pain.  Since then she still in a fair amount of pain but has also developed severe chronic fatigue and "brain fog."  This is also been accompanied by significant anxiety tachycardia and absolutely no energy to do  anything.  She also has difficulty with sleeping.  The patient has tried numerous treatment modalities.  She was diagnosed with ADD at age 43 and has been on Adderall at varying doses ever since.  It helps to some degree.  She states that the anxiety she has can be debilitating.  She was recently prescribed Valium 5 mg which is "help more than anything else."  She still uses acupuncture but has tried numerous supplements and treatment modalities such as dry needling and massage.  She went to the [facility] clinic which she uses functional brain scanning to make diagnoses and psychiatry which has not been endorsed by the APA.  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  She is on Wellbutrin but is gradually decreasing the dose as that she does not feel it is helped that much.  At present she is somewhat depressed but primarily anxious.  She has difficulty sleeping although the Valium has helped.  She is only been on it about a week.  She takes Adderall XR 20 mg in the morning and sometimes supplements it was 10 or 20 more milligrams of regular Adderall through the day.  She states that she is "barely hanging on" at work because of difficulty focus and severe anxiety.  I suggested that we use a longer acting stimulant to help with the focus and she is agreeable.  The Valium is helped so this should be continued for now.  Rather than change  everything it well so I suggested we look at the antidepressant and how it is working at the next visit.  Patient returns for follow-up after 4 weeks.  She states a lot of things have been going on in her life.  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  They are in therapy together and she is also going to therapy by herself.  This has been very stressful  for her.  She still is having a lot of fatigue particularly after work.  The Vyvanse 40 mg is working to a degree but is wearing off mid afternoon.  I suggest that we move to a higher dosage.  She is still somewhat depressed but thinks if we could get the stimulant improve things would get better for her.  She denies thoughts of self-harm or suicide.  The Valium continues to help with the anxiety although she has to wait till evening to take it because it makes her drowsy.   Visit Diagnosis:    ICD-10-CM   1. Attention deficit hyperactivity disorder (ADHD), combined type  F90.2     2. Generalized anxiety disorder  F41.1     3. Episodic mood disorder (HCC)  F39       Past Psychiatric History: Patient saw psychiatrist in [place] 7 years ago for treatment with Adderall for ADHD.  She was seen by psychiatrist at the [facility] clinic.  She has been looking for a therapist and has been in therapy on and off since high school  Past Medical History:  Past Medical History:  Diagnosis Date   Adductor tendinitis dx 2018   bilateral goin pain--- hx PRP treatment's 2019 @ [facility]   ADHD (attention deficit hyperactivity disorder)    Bilateral groin pain    Chronic insomnia    Chronic pain syndrome    secondary to trauma sport injury   Chronic pain syndrome    Fatigue    H/O cold sores    Plantar fasciitis of left foot    Seasonal affective disorder ([place]) 2013    Past Surgical History:  Procedure Laterality Date   LAPAROSCOPIC INGUINAL HERNIA REPAIR Bilateral 06-02-2015  in [place], [place]   w/ mesh   LAPAROSCOPIC TUBAL LIGATION Bilateral 10/19/2018   Procedure: LAPAROSCOPIC TUBAL LIGATION;  Surgeon: Arvella Nigh, MD;  Location: [facility];  Service: Gynecology;  Laterality: Bilateral;    Family Psychiatric History: see below  Family History:  Family History  Problem Relation Age of Onset   ADD / ADHD Mother    Anxiety disorder Father    Asthma Maternal Grandmother    Stroke  Maternal Grandmother    Diabetes Maternal Grandmother     Social History:  Social History   Socioeconomic History   Marital status: Single    Spouse name: Not on file   Number of children: 0   Years of education: Not on file   Highest education level: Not on file  Occupational History    Employer: [employer]  Tobacco Use   Smoking status: Former    Packs/day: 0.50    Years: 5.00    Total pack years: 2.50    Types: Cigarettes    Quit date: 10/14/2011    Years since quitting: 10.0   Smokeless tobacco: Never  Vaping Use   Vaping Use: Never used  Substance and Sexual Activity   Alcohol use: Yes    Comment: 2-3 per day   Drug use: Not Currently    Types: Marijuana   Sexual activity: Yes    Birth control/protection: Surgical  Other Topics Concern   Not on file  Social History Narrative   Not on file   Social Determinants of Health   Financial Resource Strain: Not on file  Food Insecurity: Not on file  Transportation Needs: Not on file  Physical Activity: Not on file  Stress: Not on file  Social Connections: Not on file    Allergies: No Known Allergies  Metabolic Disorder Labs: No results found for: "HGBA1C", "MPG" No results found for: "PROLACTIN" No results found for: "CHOL", "TRIG", "HDL", "CHOLHDL", "VLDL", "LDLCALC" Lab Results  Component Value Date   TSH 1.314 03/26/2019    Therapeutic Level Labs: No results found for: "LITHIUM" No results found for: "VALPROATE" No results found for: "CBMZ"  Current Medications: Current Outpatient Medications  Medication Sig Dispense Refill   lisdexamfetamine (VYVANSE) 60 MG capsule Take 1 capsule (60 mg total) by mouth every morning. 30 capsule 0   ACZONE 7.5 % GEL Apply 1 application topically at bedtime. After application of Tazorac  2   azelastine (ASTELIN) 0.1 % nasal spray Place 2 sprays into both nostrils 2 (two) times daily. Use in each nostril as directed 30 mL 5   buPROPion (WELLBUTRIN XL) 150 MG 24  hr tablet Take 1 tablet (150 mg total) by mouth every morning. 30 tablet 2   buPROPion (WELLBUTRIN XL) 300 MG 24 hr tablet Take 1 tablet (300 mg total) by mouth every morning. 30 tablet 2   diazepam (VALIUM) 5 MG tablet Take 1 tablet (5 mg total) by mouth 2 (two) times daily as needed for anxiety. 60 tablet 2   No current facility-administered medications for this visit.     Musculoskeletal: Strength & Muscle Tone: within normal limits Gait & Station: normal Patient leans: N/A  Psychiatric Specialty Exam: Review of Systems  Constitutional:  Positive for fatigue.  Psychiatric/Behavioral:  Positive for decreased concentration and dysphoric mood. The patient is nervous/anxious.   All other systems reviewed and are negative.   There were no vitals taken for this visit.There is no height or weight on file to calculate BMI.  General Appearance: Casual and Fairly Groomed  Eye Contact:  Good  Speech:  Clear and Coherent  Volume:  Normal  Mood:  Anxious  Affect:  Appropriate and Congruent  Thought Process:  Goal Directed  Orientation:  Full (Time, Place, and Person)  Thought Content: Rumination   Suicidal Thoughts:  No  Homicidal Thoughts:  No  Memory:  Immediate;   Good Recent;   Good Remote;   Good  Judgement:  Good  Insight:  Good  Psychomotor Activity:  Decreased  Concentration:  Concentration: Fair and Attention Span: Fair  Recall:  Good  Fund of Knowledge: Good  Language: Good  Akathisia:  No  Handed:  Right  AIMS (if indicated): not done  Assets:  Communication Skills Desire for Improvement Resilience Social Support Talents/Skills  ADL's:  Intact  Cognition: WNL  Sleep:  Fair   Screenings: GAD-7    Flowsheet Row Office Visit from 01/15/2018 in Primary Care at [facility]  Total GAD-7 Score 1      PHQ2-9    Flowsheet Row Video [facility] Visit from 10/20/2021  [facility] Office Visit from 02/19/2018 in Primary Care at [facility] from 01/15/2018 in Primary Care at [facility] Visit from 11/24/2017 in Primary Care at [facility] Total Score 3 6 2 3 2   PHQ-9 Total Score 11 19 -- 14 12        Assessment and Plan: This patient is a 31 year old white female with a history of ADHD, chronic fatigue chronic pain depression and significant anxiety.  For now she will continue Valium 5 mg twice daily as needed for anxiety, Wellbutrin XL 150 mg for depression.  Since she still not focusing that well and the Vyvanse is not lasting through the day we will go to 60 mg every morning.  She will return to see me in 4 weeks  Collaboration of Care: Collaboration of Care: Primary Care Provider AEB notes are shared with PCP on the epic system  Patient/Guardian was advised Release of Information must be obtained prior to any record release in order to collaborate their care with an outside provider. Patient/Guardian was advised if they have not already done so to contact the registration department to sign all necessary forms in order for [place] to release information regarding their care.   Consent: Patient/Guardian gives verbal consent for treatment and assignment of benefits for services provided during this visit. Patient/Guardian expressed understanding and agreed to proceed.    Levonne Spiller, MD 11/16/2021, 11:59 AM

## 2021-11-23 ENCOUNTER — Telehealth (HOSPITAL_COMMUNITY): Payer: Self-pay | Admitting: *Deleted

## 2021-11-23 ENCOUNTER — Other Ambulatory Visit (HOSPITAL_COMMUNITY): Payer: Self-pay | Admitting: Psychiatry

## 2021-11-23 MED ORDER — AMPHETAMINE-DEXTROAMPHET ER 20 MG PO CP24: 20.0000 mg | ORAL_CAPSULE | ORAL | 0 refills | Status: DC

## 2021-11-23 NOTE — Telephone Encounter (Signed)
Patient called stating her current pharmacy is out of stock for the Vyvanse and would like to change to another medication. Per pt she is trying to switch to Adderall. Per pt when she was on this previously it worked and plus Vyvanse is getting more expensive. Per pt she is aware of the shortage for ADHD meds

## 2021-11-23 NOTE — Telephone Encounter (Signed)
Tell her Adderall XR 20 mg sent in

## 2021-12-14 ENCOUNTER — Encounter (HOSPITAL_COMMUNITY): Payer: Self-pay | Admitting: Psychiatry

## 2021-12-14 ENCOUNTER — Telehealth (INDEPENDENT_AMBULATORY_CARE_PROVIDER_SITE_OTHER): Payer: 59 | Admitting: Psychiatry

## 2021-12-14 DIAGNOSIS — F902 Attention-deficit hyperactivity disorder, combined type: Secondary | ICD-10-CM | POA: Diagnosis not present

## 2021-12-14 DIAGNOSIS — F411 Generalized anxiety disorder: Secondary | ICD-10-CM | POA: Diagnosis not present

## 2021-12-14 DIAGNOSIS — F39 Unspecified mood [affective] disorder: Secondary | ICD-10-CM

## 2021-12-14 MED ORDER — DIAZEPAM 5 MG PO TABS: 5.0000 mg | ORAL_TABLET | Freq: Three times a day (TID) | ORAL | 2 refills | Status: DC | PRN

## 2021-12-14 MED ORDER — AMPHETAMINE-DEXTROAMPHET ER 30 MG PO CP24: 30.0000 mg | ORAL_CAPSULE | Freq: Every day | ORAL | 0 refills | Status: DC

## 2021-12-14 MED ORDER — AMPHETAMINE-DEXTROAMPHET ER 30 MG PO CP24: 30.0000 mg | ORAL_CAPSULE | ORAL | 0 refills | Status: DC

## 2021-12-14 MED ORDER — BUPROPION HCL ER (XL) 150 MG PO TB24: 150.0000 mg | ORAL_TABLET | ORAL | 2 refills | Status: DC

## 2021-12-14 NOTE — Progress Notes (Signed)
Virtual Visit via Video Note  I connected with Erin Pierce on 12/14/21 at 10:20 AM EDT by a video enabled telemedicine application and verified that I am speaking with the correct person using two identifiers.  Location: Patient home Provider: office   I discussed the limitations of evaluation and management by telemedicine and the availability of in person appointments. The patient expressed understanding and agreed to proceed.     I discussed the assessment and treatment plan with the patient. The patient was provided an opportunity to ask questions and all were answered. The patient agreed with the plan and demonstrated an understanding of the instructions.   The patient was advised to call back or seek an in-person evaluation if the symptoms worsen or if the condition fails to improve as anticipated.  I provided 15 minutes of non-face-to-face time during this encounter.   [name], MD  [facility] MD/PA/NP OP Progress Note  12/14/2021 10:56 AM Erin Pierce  MRN:  [id number]  Chief Complaint:  Chief Complaint  Patient presents with   Anxiety   Depression   ADD   Follow-up   HPI: This patient is a 31 year old white female who is living with a boyfriend in [place].  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  The patient was referred by her PCP [name] for further evaluation of ADD anxiety depression and chronic fatigue.  The patient states that she has had significant health issues that date back to 2014.  She states at that time she was having a lot of groin pain and was diagnosed with with bilateral hernias.  She developed tendinopathy after surgeries for the hernias.  She has also had less than planter fasciitis and left knee pain.  Since then she still in a fair amount of pain but has also developed severe chronic fatigue and "brain fog."  This is also been accompanied by significant anxiety tachycardia and absolutely no energy to do  anything.  She also has difficulty with sleeping.  The patient has tried numerous treatment modalities.  She was diagnosed with ADD at age 31 and has been on Adderall at varying doses ever since.  It helps to some degree.  She states that the anxiety she has can be debilitating.  She was recently prescribed Valium 5 mg which is "help more than anything else."  She still uses acupuncture but has tried numerous supplements and treatment modalities such as dry needling and massage.  She went to the [facility] clinic which she uses functional brain scanning to make diagnoses and psychiatry which has not been endorsed by the APA.  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  She is on Wellbutrin but is gradually decreasing the dose as that she does not feel it is helped that much.  At present she is somewhat depressed but primarily anxious.  She has difficulty sleeping although the Valium has helped.  She is only been on it about a week.  She takes Adderall XR 20 mg in the morning and sometimes supplements it was 10 or 20 more milligrams of regular Adderall through the day.  She states that she is "barely hanging on" at work because of difficulty focus and severe anxiety.  I suggested that we use a longer acting stimulant to help with the focus and she is agreeable.  The Valium is helped so this should be continued for now.  Rather than change everything  it well so I suggested we look at the antidepressant and how it is working at the next visit.  Patient returns for follow-up after 4 weeks.  She called in the interim stating that the Vyvanse was just getting too expensive and wanted to go back to the Adderall XR.  She is now on 20 mg.  This really is not lasting long enough so we will increase it to 30 mg.  She is also going through a lot of stress in her relationship.  She and her boyfriend are not getting along and they can come to terms things like the dogs in the  house and his alcohol use.  She is not sure if the relationship will last.  The Valium seems to help her more than anything in terms of energy and mood so we will increase it to 5 mg 3 times daily.  The Wellbutrin also helps to some degree.  She is trying to stay busy and active and she denies thoughts of self-harm or suicidal ideation Visit Diagnosis:    ICD-10-CM   1. Attention deficit hyperactivity disorder (ADHD), combined type  F90.2     2. Generalized anxiety disorder  F41.1     3. Episodic mood disorder (HCC)  F39       Past Psychiatric History: Patient saw psychiatrist in [place] 7 years ago for treatment with Adderall for ADHD.  She was seen by psychiatrist at the [facility] clinic.  She has been looking for a therapist and has been in therapy on and off since high school  Past Medical History:  Past Medical History:  Diagnosis Date   Adductor tendinitis dx 2018   bilateral goin pain--- hx PRP treatment's 2019 @ [facility]   ADHD (attention deficit hyperactivity disorder)    Bilateral groin pain    Chronic insomnia    Chronic pain syndrome    secondary to trauma sport injury   Chronic pain syndrome    Fatigue    H/O cold sores    Plantar fasciitis of left foot    Seasonal affective disorder (HCC) 2013    Past Surgical History:  Procedure Laterality Date   LAPAROSCOPIC INGUINAL HERNIA REPAIR Bilateral 06-02-2015  in [place], [place]   w/ mesh   LAPAROSCOPIC TUBAL LIGATION Bilateral 10/19/2018   Procedure: LAPAROSCOPIC TUBAL LIGATION;  Surgeon: Richardean Chimera, MD;  Location: [facility] [place];  Service: Gynecology;  Laterality: Bilateral;    Family Psychiatric History: see below  Family History:  Family History  Problem Relation Age of Onset   ADD / ADHD Mother    Anxiety disorder Father    Asthma Maternal Grandmother    Stroke Maternal Grandmother    Diabetes Maternal Grandmother     Social History:  Social History   Socioeconomic History   Marital status:  Single    Spouse name: Not on file   Number of children: 0   Years of education: Not on file   Highest education level: Not on file  Occupational History    Employer: [employer]  Tobacco Use   Smoking status: Former    Packs/day: 0.50    Years: 5.00    Total pack years: 2.50    Types: Cigarettes    Quit date: 10/14/2011    Years since quitting: 10.1   Smokeless tobacco: Never  Vaping Use   Vaping Use: Never used  Substance and Sexual Activity   Alcohol use: Yes    Comment: 2-3 per day   Drug  use: Not Currently    Types: Marijuana   Sexual activity: Yes    Birth control/protection: Surgical  Other Topics Concern   Not on file  Social History Narrative   Not on file   Social Determinants of Health   Financial Resource Strain: Not on file  Food Insecurity: Not on file  Transportation Needs: Not on file  Physical Activity: Not on file  Stress: Not on file  Social Connections: Not on file    Allergies: No Known Allergies  Metabolic Disorder Labs: No results found for: "HGBA1C", "MPG" No results found for: "PROLACTIN" No results found for: "CHOL", "TRIG", "HDL", "CHOLHDL", "VLDL", "LDLCALC" Lab Results  Component Value Date   TSH 1.314 03/26/2019    Therapeutic Level Labs: No results found for: "LITHIUM" No results found for: "VALPROATE" No results found for: "CBMZ"  Current Medications: Current Outpatient Medications  Medication Sig Dispense Refill   amphetamine-dextroamphetamine (ADDERALL XR) 30 MG 24 hr capsule Take 1 capsule (30 mg total) by mouth daily. 30 capsule 0   amphetamine-dextroamphetamine (ADDERALL XR) 30 MG 24 hr capsule Take 1 capsule (30 mg total) by mouth every morning. 30 capsule 0   ACZONE 7.5 % GEL Apply 1 application topically at bedtime. After application of Tazorac  2   azelastine (ASTELIN) 0.1 % nasal spray Place 2 sprays into both nostrils 2 (two) times daily. Use in each nostril as directed 30 mL 5   buPROPion (WELLBUTRIN XL)  150 MG 24 hr tablet Take 1 tablet (150 mg total) by mouth every morning. 30 tablet 2   diazepam (VALIUM) 5 MG tablet Take 1 tablet (5 mg total) by mouth 3 (three) times daily as needed for anxiety. 90 tablet 2   No current facility-administered medications for this visit.     Musculoskeletal: Strength & Muscle Tone: within normal limits Gait & Station: normal Patient leans: N/A  Psychiatric Specialty Exam: Review of Systems  Constitutional:  Positive for fatigue.  Psychiatric/Behavioral:  Positive for decreased concentration. The patient is nervous/anxious.   All other systems reviewed and are negative.   There were no vitals taken for this visit.There is no height or weight on file to calculate BMI.  General Appearance: Casual and Fairly Groomed  Eye Contact:  Good  Speech:  Clear and Coherent  Volume:  Normal  Mood:  Anxious and Euthymic  Affect:  Congruent  Thought Process:  Goal Directed  Orientation:  Full (Time, Place, and Person)  Thought Content: WDL   Suicidal Thoughts:  No  Homicidal Thoughts:  No  Memory:  Immediate;   Good Recent;   Good Remote;   Fair  Judgement:  Good  Insight:  Good  Psychomotor Activity:  Decreased  Concentration:  Concentration: Fair and Attention Span: Fair  Recall:  Good  Fund of Knowledge: Good  Language: Good  Akathisia:  No  Handed:  Right  AIMS (if indicated): not done  Assets:  Communication Skills Desire for Improvement Physical Health Resilience Social Support Talents/Skills  ADL's:  Intact  Cognition: WNL  Sleep:  Good   Screenings: GAD-7    Flowsheet Row Office Visit from 01/15/2018 in Primary Care at [place]  Total GAD-7 Score 1      PHQ2-9    Flowsheet Row Video [facility] Video Visit from 11/16/2021 in [facility] Office Visit from 10/20/2021 in [facility] Office  Visit from 02/19/2018 in Primary Care at [facility]  Visit from 01/15/2018 in Primary Care at [facility] Total Score 1 3 6 2 3   PHQ-9 Total Score -- 11 19 -- 14      Flowsheet Row Video Visit from 12/14/2021 in [facility]  C-SSRS RISK CATEGORY No Risk        Assessment and Plan: This patient is a 31 year old white female with a history of ADHD, chronic fatigue chronic pain depression and significant anxiety.  She has been more anxious lately so we will increase Valium to 5 mg 3 times daily as needed and continue Wellbutrin XL 150 mg daily for depression.  She will increase Adderall XR to 30 mg every morning for improved focus.  She will return to see me in 2 months  Collaboration of Care: Collaboration of Care: Primary Care Provider AEB notes are shared with PCP through the epic system  Patient/Guardian was advised Release of Information must be obtained prior to any record release in order to collaborate their care with an outside provider. Patient/Guardian was advised if they have not already done so to contact the registration department to sign all necessary forms in order for 32 to release information regarding their care.   Consent: Patient/Guardian gives verbal consent for treatment and assignment of benefits for services provided during this visit. Patient/Guardian expressed understanding and agreed to proceed.    [place], MD 12/14/2021, 10:56 AM

## 2022-02-04 ENCOUNTER — Telehealth (HOSPITAL_COMMUNITY): Payer: Self-pay | Admitting: Psychiatry

## 2022-02-18 ENCOUNTER — Encounter (HOSPITAL_COMMUNITY): Payer: Self-pay | Admitting: Psychiatry

## 2022-02-18 ENCOUNTER — Telehealth (INDEPENDENT_AMBULATORY_CARE_PROVIDER_SITE_OTHER): Payer: 59 | Admitting: Psychiatry

## 2022-02-18 DIAGNOSIS — F902 Attention-deficit hyperactivity disorder, combined type: Secondary | ICD-10-CM | POA: Diagnosis not present

## 2022-02-18 DIAGNOSIS — F411 Generalized anxiety disorder: Secondary | ICD-10-CM | POA: Diagnosis not present

## 2022-02-18 DIAGNOSIS — F39 Unspecified mood [affective] disorder: Secondary | ICD-10-CM

## 2022-02-18 MED ORDER — AMPHETAMINE-DEXTROAMPHETAMINE 10 MG PO TABS: 10.0000 mg | ORAL_TABLET | Freq: Every day | ORAL | 0 refills | Status: DC

## 2022-02-18 MED ORDER — AMPHETAMINE-DEXTROAMPHET ER 30 MG PO CP24: 30.0000 mg | ORAL_CAPSULE | Freq: Every day | ORAL | 0 refills | Status: DC

## 2022-02-18 MED ORDER — DIAZEPAM 5 MG PO TABS: 5.0000 mg | ORAL_TABLET | Freq: Three times a day (TID) | ORAL | 2 refills | Status: DC | PRN

## 2022-02-18 MED ORDER — BUPROPION HCL ER (XL) 150 MG PO TB24: 150.0000 mg | ORAL_TABLET | ORAL | 2 refills | Status: DC

## 2022-02-18 MED ORDER — AMPHETAMINE-DEXTROAMPHET ER 30 MG PO CP24: 30.0000 mg | ORAL_CAPSULE | ORAL | 0 refills | Status: DC

## 2022-02-18 NOTE — Progress Notes (Signed)
Virtual Visit via Video Note  I connected with Erin Pierce on 02/18/22 at 11:40 AM EST by a video enabled telemedicine application and verified that I am speaking with the correct person using two identifiers.  Location: Patient: home Provider: office   I discussed the limitations of evaluation and management by telemedicine and the availability of in person appointments. The patient expressed understanding and agreed to proceed.      I discussed the assessment and treatment plan with the patient. The patient was provided an opportunity to ask questions and all were answered. The patient agreed with the plan and demonstrated an understanding of the instructions.   The patient was advised to call back or seek an in-person evaluation if the symptoms worsen or if the condition fails to improve as anticipated.  I provided 20 minutes of non-face-to-face time during this encounter.   [name], MD  [facility] MD/PA/NP OP Progress Note  02/18/2022 12:07 PM Erin Pierce  MRN:  [id number]  Chief Complaint:  Chief Complaint  Patient presents with   Depression   ADD   Anxiety   Follow-up   HPI:  This patient is a 31 year old white female who is living with a boyfriend in [place].  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  The patient was referred by her PCP [name] for further evaluation of ADD anxiety depression and chronic fatigue.  The patient states that she has had significant health issues that date back to 2014.  She states at that time she was having a lot of groin pain and was diagnosed with with bilateral hernias.  She developed tendinopathy after surgeries for the hernias.  She has also had less than planter fasciitis and left knee pain.  Since then she still in a fair amount of pain but has also developed severe chronic fatigue and "brain fog."  This is also been accompanied by significant anxiety tachycardia and absolutely no energy to  do anything.  She also has difficulty with sleeping.  The patient has tried numerous treatment modalities.  She was diagnosed with ADD at age 69 and has been on Adderall at varying doses ever since.  It helps to some degree.  She states that the anxiety she has can be debilitating.  She was recently prescribed Valium 5 mg which is "help more than anything else."  She still uses acupuncture but has tried numerous supplements and treatment modalities such as dry needling and massage.  She went to the [facility] clinic which she uses functional brain scanning to make diagnoses and psychiatry which has not been endorsed by the APA.  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  She is on Wellbutrin but is gradually decreasing the dose as that she does not feel it is helped that much.  At present she is somewhat depressed but primarily anxious.  She has difficulty sleeping although the Valium has helped.  She is only been on it about a week.  She takes Adderall XR 20 mg in the morning and sometimes supplements it was 10 or 20 more milligrams of regular Adderall through the day.  She states that she is "barely hanging on" at work because of difficulty focus and severe anxiety.  I suggested that we use a longer acting stimulant to help with the focus and she is agreeable.  The Valium is helped so this should be continued for now.  Rather than  change everything it well so I suggested we look at the antidepressant and how it is working at the next visit.   The patient returns for follow-up after 2 months.  She states that she was recently diagnosed with Lyme disease as well as with some sort of organism that similar to malaria.  She just underwent several weeks of treatment with azithromycin and Mepron.  She is now on some herbal remedies.  She states that these medications "knock me out."  She states that she is very tired and has very little energy is barely getting up  every day to do her job even though she works from home.  She and her boyfriend are getting along okay but not great.  She does think that the medicines for focus depression and anxiety have helped to some degree.  She thinks she will be able to judge this better after she is done with all these treatment protocols. Visit Diagnosis:    ICD-10-CM   1. Attention deficit hyperactivity disorder (ADHD), combined type  F90.2     2. Generalized anxiety disorder  F41.1     3. Episodic mood disorder (HCC)  F39       Past Psychiatric History: Patient saw psychiatrist in [place] 7 years ago for treatment with Adderall for ADHD.  She was seen by psychiatrist at the [facility] clinic.  She has been looking for a therapist and has been in therapy on and off since high school   Past Medical History:  Past Medical History:  Diagnosis Date   Adductor tendinitis dx 2018   bilateral goin pain--- hx PRP treatment's 2019 @ [facility]   ADHD (attention deficit hyperactivity disorder)    Bilateral groin pain    Chronic insomnia    Chronic pain syndrome    secondary to trauma sport injury   Chronic pain syndrome    Fatigue    H/O cold sores    Plantar fasciitis of left foot    Seasonal affective disorder (HCC) 2013    Past Surgical History:  Procedure Laterality Date   LAPAROSCOPIC INGUINAL HERNIA REPAIR Bilateral 06-02-2015  in [place], [place]   w/ mesh   LAPAROSCOPIC TUBAL LIGATION Bilateral 10/19/2018   Procedure: LAPAROSCOPIC TUBAL LIGATION;  Surgeon: Richardean Chimera, MD;  Location: [facility] [place];  Service: Gynecology;  Laterality: Bilateral;    Family Psychiatric History: See below  Family History:  Family History  Problem Relation Age of Onset   ADD / ADHD Mother    Anxiety disorder Father    Asthma Maternal Grandmother    Stroke Maternal Grandmother    Diabetes Maternal Grandmother     Social History:  Social History   Socioeconomic History   Marital status: Single    Spouse  name: Not on file   Number of children: 0   Years of education: Not on file   Highest education level: Not on file  Occupational History    Employer: [employer]  Tobacco Use   Smoking status: Former    Packs/day: 0.50    Years: 5.00    Total pack years: 2.50    Types: Cigarettes    Quit date: 10/14/2011    Years since quitting: 10.3   Smokeless tobacco: Never  Vaping Use   Vaping Use: Never used  Substance and Sexual Activity   Alcohol use: Yes    Comment: 2-3 per day   Drug use: Not Currently    Types: Marijuana   Sexual activity: Yes  Birth control/protection: Surgical  Other Topics Concern   Not on file  Social History Narrative   Not on file   Social Determinants of Health   Financial Resource Strain: Not on file  Food Insecurity: Not on file  Transportation Needs: Not on file  Physical Activity: Not on file  Stress: Not on file  Social Connections: Not on file    Allergies: No Known Allergies  Metabolic Disorder Labs: No results found for: "HGBA1C", "MPG" No results found for: "PROLACTIN" No results found for: "CHOL", "TRIG", "HDL", "CHOLHDL", "VLDL", "LDLCALC" Lab Results  Component Value Date   TSH 1.314 03/26/2019    Therapeutic Level Labs: No results found for: "LITHIUM" No results found for: "VALPROATE" No results found for: "CBMZ"  Current Medications: Current Outpatient Medications  Medication Sig Dispense Refill   amphetamine-dextroamphetamine (ADDERALL) 10 MG tablet Take 1 tablet (10 mg total) by mouth daily. 30 tablet 0   amphetamine-dextroamphetamine (ADDERALL) 10 MG tablet Take 1 tablet (10 mg total) by mouth daily. 30 tablet 0   ACZONE 7.5 % GEL Apply 1 application topically at bedtime. After application of Tazorac  2   amphetamine-dextroamphetamine (ADDERALL XR) 30 MG 24 hr capsule Take 1 capsule (30 mg total) by mouth daily. 30 capsule 0   amphetamine-dextroamphetamine (ADDERALL XR) 30 MG 24 hr capsule Take 1 capsule (30 mg  total) by mouth every morning. 30 capsule 0   azelastine (ASTELIN) 0.1 % nasal spray Place 2 sprays into both nostrils 2 (two) times daily. Use in each nostril as directed 30 mL 5   buPROPion (WELLBUTRIN XL) 150 MG 24 hr tablet Take 1 tablet (150 mg total) by mouth every morning. 30 tablet 2   diazepam (VALIUM) 5 MG tablet Take 1 tablet (5 mg total) by mouth 3 (three) times daily as needed for anxiety. 90 tablet 2   No current facility-administered medications for this visit.     Musculoskeletal: Strength & Muscle Tone: within normal limits Gait & Station: normal Patient leans: N/A  Psychiatric Specialty Exam: Review of Systems  Constitutional:  Positive for fatigue.  Psychiatric/Behavioral:  Positive for decreased concentration.   All other systems reviewed and are negative.   There were no vitals taken for this visit.There is no height or weight on file to calculate BMI.  General Appearance: Casual, Neat, and Well Groomed  Eye Contact:  Good  Speech:  Clear and Coherent  Volume:  Normal  Mood:  Anxious  Affect:  Congruent  Thought Process:  Goal Directed  Orientation:  Full (Time, Place, and Person)  Thought Content: Rumination   Suicidal Thoughts:  No  Homicidal Thoughts:  No  Memory:  Immediate;   Good Recent;   Good Remote;   Good  Judgement:  Good  Insight:  Good  Psychomotor Activity:  Decreased  Concentration:  Concentration: Fair and Attention Span: Fair  Recall:  Good  Fund of Knowledge: Good  Language: Good  Akathisia:  No  Handed:  Right  AIMS (if indicated): not done  Assets:  Communication Skills Desire for Improvement Resilience Social Support Talents/Skills  ADL's:  Intact  Cognition: WNL  Sleep:  Good   Screenings: GAD-7    Flowsheet Row Office Visit from 01/15/2018 in Primary Care at [place]  Total GAD-7 Score 1      PHQ2-9    Flowsheet Row Video Visit from [facility]/14/2021 [facility] Video Visit  from 11/16/2021 in [facility] Office Visit from 10/20/2021 in  [place] [facility] Office Visit from 02/19/2018 in Primary Care at [place] from 01/15/2018 in Primary Care at [facility] Total Score 1 3 6 2 3   PHQ-9 Total Score -- 11 19 -- 14      Flowsheet Row Video Visit from 12/14/2021 in [place] No Risk        Assessment and Plan: This patient is a 31 year old female with a history of ADHD chronic fatigue chronic pain depression and anxiety.  She is just gone through some protocols which seem unusual to me but are managed by an alternative medicine clinic.  She does feel like they are going to help in the long run so she is going to try to stick it out.  For now she does not really want to change anything from in terms of her psychiatric medicine so we will continue Wellbutrin XL 150 mg daily for depression, Adderall XR 30 mg every morning for ADD as well as Adderall 10 mg for augmentation, Valium 5 mg 3 times daily as needed for anxiety.  She will return to see me in 2 months  Collaboration of Care: Collaboration of Care: Primary Care Provider AEB notes are shared with PCP through the epic system  Patient/Guardian was advised Release of Information must be obtained prior to any record release in order to collaborate their care with an outside provider. Patient/Guardian was advised if they have not already done so to contact the registration department to sign all necessary forms in order for [place] to release information regarding their care.   Consent: Patient/Guardian gives verbal consent for treatment and assignment of benefits for services provided during this visit. Patient/Guardian expressed understanding and agreed to proceed.    Levonne Spiller, MD 02/18/2022, 12:07 PM

## 2022-03-13 ENCOUNTER — Encounter: Payer: Self-pay | Admitting: Infectious Disease

## 2022-03-13 ENCOUNTER — Ambulatory Visit (INDEPENDENT_AMBULATORY_CARE_PROVIDER_SITE_OTHER): Payer: 59 | Admitting: Infectious Disease

## 2022-03-13 ENCOUNTER — Other Ambulatory Visit: Payer: Self-pay

## 2022-03-13 VITALS — BP 112/80 | HR 100 | Resp 16 | Ht 66.0 in | Wt 111.0 lb

## 2022-03-13 DIAGNOSIS — F902 Attention-deficit hyperactivity disorder, combined type: Secondary | ICD-10-CM | POA: Diagnosis not present

## 2022-03-13 DIAGNOSIS — R5382 Chronic fatigue, unspecified: Secondary | ICD-10-CM | POA: Diagnosis not present

## 2022-03-13 DIAGNOSIS — G4701 Insomnia due to medical condition: Secondary | ICD-10-CM

## 2022-03-13 DIAGNOSIS — B6000 Babesiosis, unspecified: Secondary | ICD-10-CM

## 2022-03-13 DIAGNOSIS — G8929 Other chronic pain: Secondary | ICD-10-CM

## 2022-03-13 DIAGNOSIS — R410 Disorientation, unspecified: Secondary | ICD-10-CM

## 2022-03-13 DIAGNOSIS — Z8619 Personal history of other infectious and parasitic diseases: Secondary | ICD-10-CM

## 2022-03-13 HISTORY — DX: Other chronic pain: G89.29

## 2022-03-13 HISTORY — DX: Chronic fatigue, unspecified: R53.82

## 2022-03-13 HISTORY — DX: Disorientation, unspecified: R41.0

## 2022-03-13 NOTE — Progress Notes (Signed)
Reason for infectious disease consult: "Alternative Lyme criteria positive IgG Western blot--though IgG Western blot based on CDC and IDSA guidelines is clearly without any doubt negative, along with positive antibodies for PCR through a lab known as [facility] out of [place] ,  [place]  Requesting Physician: Lavada Mesi, MD   Subjective:    Patient ID: Erin Pierce, female    DOB: 30-Nov-1990, 31 y.o.   MRN: [id number]  HPI  Erin Pierce is a 31 year old Caucasian female with a history of depression and ADHD, insomnia chronic fatigue who had previously been evaluated by my partner Dr. Luciana Axe via telephone visit in 2021.  At that time she had been complaining of symptoms over 7 years.  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  She had had a number of other test done there as well including CMV testing testing for tickborne infections and testing of her urine for fungal pathogens.  My partner Dr. Luciana Axe did not feel that any of these labs were related to any of her symptoms.  He had advocated for her to be tested for HIV hepatitis B and hepatitis C as all of these chronic infections certainly could cause symptoms.  EBV certainly can recur but typically in the context of EBV related malignancy.  She subsequently has had HIV have been hep C testing all of which were negative.  Continues to suffer from many of the symptoms she was suffering from when she saw Dr. Luciana Axe.  She describes chronic pain particularly in her left inguinal area where she had surgery but in other areas as well profound fatigue exhaustion.  She and her mom accompanies her say that she is continuing to's viral downwards.  She is now changed over to Dr. Prince Rome for primary care.  She sought testing for a number of infectious diseases through him and these were performed at  this lab known [facility].  The types of labs that this organization did was fairly shocking to me in terms of some of the pathogens that they tested for including for example but Bartonella and tan which causes trench fever and is typically seen in refugee camps and in war zones for example.  In any case among the labs that were done in an day will be shown to below she had Western blot testing for Lyme though she had not had a positive Theora Gianotti her Western blot was negative on IgM was 0/3 bands positive her IgG was similarly negative with only 2 out of 8 bands positive note 5 out of 8 bands are needing to be positive to make a diagnosis serologically of Lyme.  However this particular lab for what ever reason has some type of "alternative diagnostic methodology that they specifically say is different than the infectious ease side of [place] or the [facility] and they claimed that this methodology showed the test to be positive  Really do not understand how they can possibly make such claims.  Is quite interesting also in looking through the labs that there is verbiage towards the end of disclaimer in fact pointing out that the labs should not substitute the place for clinical decision making and that they are there only for educational purposes"  She had also antibodies to the same organization that were positive for Babesia and she has now  finished a month of atovaquone and azithromycin.  While she did live in [place] and certainly could have been exposed to Babesia and potentially even if had infection in the past and these antibodies might have reflected past infection there is no way that the symptoms that she has been having for the past nearly 10 years have anything to do with Babesia    Babesia is not a subtle infection it is acute infection which can be quite deadly and life-threatening and frequently obligated by hemolytic anemia thrombocytopenia and severe  illness.  Fortunately there is no infectious disease that I can think of that would [place] to her chronic symptoms of foggy headedness profound fatigue chronic pain exhaustion.                                  Past Medical History:  Diagnosis Date   Adductor tendinitis dx 2018   bilateral goin pain--- hx PRP treatment's 2019 @ [facility]   ADHD (attention deficit hyperactivity disorder)    Bilateral groin pain    Chronic insomnia    Chronic pain syndrome    secondary to trauma sport injury   Chronic pain syndrome    Fatigue    H/O cold sores    Plantar fasciitis of left foot    Seasonal affective disorder (HCC) 2013    Past Surgical History:  Procedure Laterality Date   LAPAROSCOPIC INGUINAL HERNIA REPAIR Bilateral 06-02-2015  in [place], [place]   w/ mesh   LAPAROSCOPIC TUBAL LIGATION Bilateral 10/19/2018   Procedure: LAPAROSCOPIC TUBAL LIGATION;  Surgeon: Richardean Chimera, MD;  Location: [facility];  Service: Gynecology;  Laterality: Bilateral;    Family History  Problem Relation Age of Onset   ADD / ADHD Mother    Anxiety disorder Father    Asthma Maternal Grandmother    Stroke Maternal Grandmother    Diabetes Maternal Grandmother       Social History   Socioeconomic History   Marital status: Single    Spouse name: Not on file   Number of children: 0   Years of education: Not on file   Highest education level: Not on file  Occupational History    Employer: [employer]  Tobacco Use   Smoking status: Former    Packs/day: 0.50    Years: 5.00    Total pack years: 2.50    Types: Cigarettes    Quit date: 10/14/2011    Years since quitting: 10.4   Smokeless tobacco: Never  Vaping Use   Vaping Use: Never used  Substance and Sexual Activity   Alcohol use: Yes    Comment: 2-3 per day   Drug use: Not Currently    Types: Marijuana   Sexual activity: Yes    Birth control/protection: Surgical  Other Topics Concern   Not on  file  Social History Narrative   Not on file   Social Determinants of Health   Financial Resource Strain: Not on file  Food Insecurity: Not on file  Transportation Needs: Not on file  Physical Activity: Not on file  Stress: Not on file  Social Connections: Not on file    No Known Allergies   Current Outpatient Medications:    ACZONE 7.5 % GEL, Apply 1 application topically at bedtime. After application of Tazorac, Disp: , Rfl: 2   amphetamine-dextroamphetamine (ADDERALL XR) 30 MG 24 hr capsule, Take 1 capsule (30 mg total) by  mouth daily., Disp: 30 capsule, Rfl: 0   amphetamine-dextroamphetamine (ADDERALL XR) 30 MG 24 hr capsule, Take 1 capsule (30 mg total) by mouth every morning., Disp: 30 capsule, Rfl: 0   amphetamine-dextroamphetamine (ADDERALL) 10 MG tablet, Take 1 tablet (10 mg total) by mouth daily., Disp: 30 tablet, Rfl: 0   amphetamine-dextroamphetamine (ADDERALL) 10 MG tablet, Take 1 tablet (10 mg total) by mouth daily., Disp: 30 tablet, Rfl: 0   azelastine (ASTELIN) 0.1 % nasal spray, Place 2 sprays into both nostrils 2 (two) times daily. Use in each nostril as directed, Disp: 30 mL, Rfl: 5   buPROPion (WELLBUTRIN XL) 150 MG 24 hr tablet, Take 1 tablet (150 mg total) by mouth every morning., Disp: 30 tablet, Rfl: 2   diazepam (VALIUM) 5 MG tablet, Take 1 tablet (5 mg total) by mouth 3 (three) times daily as needed for anxiety., Disp: 90 tablet, Rfl: 2    Review of Systems  Constitutional:  Positive for activity change, appetite change and fatigue. Negative for chills, diaphoresis, fever and unexpected weight change.  HENT:  Negative for congestion, rhinorrhea, sinus pressure, sneezing, sore throat and trouble swallowing.   Eyes:  Negative for photophobia and visual disturbance.  Respiratory:  Negative for cough, chest tightness, shortness of breath, wheezing and stridor.   Cardiovascular:  Negative for chest pain, palpitations and leg swelling.  Gastrointestinal:   Negative for abdominal distention, abdominal pain, anal bleeding, blood in stool, constipation, diarrhea, nausea and vomiting.  Genitourinary:  Negative for difficulty urinating, dysuria, flank pain and hematuria.  Musculoskeletal:  Positive for arthralgias and myalgias. Negative for back pain, gait problem and joint swelling.  Skin:  Negative for color change, pallor, rash and wound.  Neurological:  Negative for dizziness, tremors, weakness and light-headedness.  Hematological:  Negative for adenopathy. Does not bruise/bleed easily.  Psychiatric/Behavioral:  Positive for confusion, decreased concentration and dysphoric mood. Negative for agitation, behavioral problems and sleep disturbance. The patient is nervous/anxious.        Objective:   Physical Exam Constitutional:      General: She is not in acute distress.    Appearance: Normal appearance. She is well-developed. She is not ill-appearing or diaphoretic.  HENT:     Head: Normocephalic and atraumatic.     Right Ear: Hearing and external ear normal.     Left Ear: Hearing and external ear normal.     Nose: No nasal deformity or rhinorrhea.  Eyes:     General: No scleral icterus.    Conjunctiva/sclera: Conjunctivae normal.     Right eye: Right conjunctiva is not injected.     Left eye: Left conjunctiva is not injected.     Pupils: Pupils are equal, round, and reactive to light.  Neck:     Vascular: No JVD.  Cardiovascular:     Rate and Rhythm: Normal rate and regular rhythm.     Heart sounds: Normal heart sounds, S1 normal and S2 normal. No murmur heard.    No friction rub.  Abdominal:     General: Bowel sounds are normal. There is no distension.     Palpations: Abdomen is soft.     Tenderness: There is no abdominal tenderness.  Musculoskeletal:        General: Normal range of motion.     Right shoulder: Normal.     Left shoulder: Normal.     Cervical back: Normal range of motion and neck supple.     Right hip: Normal.  Left hip: Normal.     Right knee: Normal.     Left knee: Normal.  Lymphadenopathy:     Head:     Right side of head: No submandibular, preauricular or posterior auricular adenopathy.     Left side of head: No submandibular, preauricular or posterior auricular adenopathy.     Cervical: No cervical adenopathy.     Right cervical: No superficial or deep cervical adenopathy.    Left cervical: No superficial or deep cervical adenopathy.  Skin:    General: Skin is warm and dry.     Coloration: Skin is not pale.     Findings: No abrasion, bruising, ecchymosis, erythema, lesion or rash.     Nails: There is no clubbing.  Neurological:     Mental Status: She is alert and oriented to person, place, and time.     Sensory: No sensory deficit.     Coordination: Coordination normal.     Gait: Gait normal.  Psychiatric:        Attention and Perception: Attention and perception normal. She is attentive.        Mood and Affect: Mood is anxious and depressed.        Speech: Speech normal.        Behavior: Behavior normal. Behavior is cooperative.        Thought Content: Thought content normal.        Cognition and Memory: Cognition and memory normal.        Judgment: Judgment normal.           Assessment & Plan:   + Serology for Babesia: first of all this was not done at a reputable lab  2nd of all the only appropriate use of Babesia is in the context of an acute illness where acute and convalescent titers are being used to establish the diagnosis.  The classic way to make the diagnosis is to perform a smear as is done for malaria to look for the Maltese cross forms within the red blood cells.  Additionally PCR testing for Babesia can be done this lab in [place] did PCR testing and for what is worth that was negative.  I have seen 0 documentation of a recent febrile illness which would sound consistent with Babesia and her antibiotics were completely unnecessary.  Misinterpreted Lyme  Western blot: Western blot should never be done with negative Shelda Pal her Western blot is clearly without any question negative on IgG testing and as mentioned by CDC IgM testing should not be done chronic symptomatology and her labs were also negative in any case for what that is worth.  Her looking antibody positivity certainly makes sense have seen him having lived in [place] but there is no indication for treatment based on an antibody test this indicates past infection at most I actually do not have access to the lab that was just shown on the sheet of paper by the Mom for me   Chronic fatigue brain fog myalgias chronic pain, diet and depression: I unfortunately do not have a test to help pin down what is wrong with her and I do not have an antibiotic or antiviral that I can provide to help her either.  I have encouraged her to exercise if possible more to engage with the primary care physician who she can relate to well and who listens to her and to find a therapist that she also gets along with well.  I spent 86 minutes with the  patient including than 50% of the time in face to face counseling of the patient and her mom with regards to her chronic symptomatology her labs that have been done in the past and more recently through this lab in [place] reviewing diagnostic criteria clinical findings of Lyme Babesia and other different infectious diseases that she has been worked up for, along with review of medical records in preparation for the visit and during the visit and in coordination of her care.

## 2022-03-14 ENCOUNTER — Ambulatory Visit: Payer: 59 | Admitting: Internal Medicine

## 2022-04-17 ENCOUNTER — Telehealth (HOSPITAL_COMMUNITY): Payer: Self-pay

## 2022-04-17 NOTE — Telephone Encounter (Signed)
Pt's appt had to be r/s'd from 04/19/22 to 05/06/22, pt is stating that her adderrall 10 XR not the immediate was on back order and she is wanting to have a rx for this, states if not can she have adderrall 20 MG XR

## 2022-04-18 ENCOUNTER — Encounter: Payer: Self-pay | Admitting: Physical Medicine and Rehabilitation

## 2022-04-19 ENCOUNTER — Telehealth (HOSPITAL_COMMUNITY): Payer: 59 | Admitting: Psychiatry

## 2022-05-06 ENCOUNTER — Encounter (HOSPITAL_COMMUNITY): Payer: Self-pay | Admitting: Psychiatry

## 2022-05-06 ENCOUNTER — Other Ambulatory Visit (HOSPITAL_COMMUNITY): Payer: Self-pay | Admitting: Psychiatry

## 2022-05-06 ENCOUNTER — Telehealth (INDEPENDENT_AMBULATORY_CARE_PROVIDER_SITE_OTHER): Payer: 59 | Admitting: Psychiatry

## 2022-05-06 DIAGNOSIS — F39 Unspecified mood [affective] disorder: Secondary | ICD-10-CM | POA: Diagnosis not present

## 2022-05-06 DIAGNOSIS — F411 Generalized anxiety disorder: Secondary | ICD-10-CM | POA: Diagnosis not present

## 2022-05-06 DIAGNOSIS — F902 Attention-deficit hyperactivity disorder, combined type: Secondary | ICD-10-CM

## 2022-05-06 MED ORDER — AMPHETAMINE-DEXTROAMPHETAMINE 10 MG PO TABS: 10.0000 mg | ORAL_TABLET | Freq: Every day | ORAL | 0 refills | Status: DC

## 2022-05-06 MED ORDER — AMPHETAMINE-DEXTROAMPHET ER 30 MG PO CP24: 30.0000 mg | ORAL_CAPSULE | Freq: Every day | ORAL | 0 refills | Status: DC

## 2022-05-06 MED ORDER — BUPROPION HCL ER (XL) 150 MG PO TB24: 150.0000 mg | ORAL_TABLET | ORAL | 2 refills | Status: DC

## 2022-05-06 MED ORDER — AMPHETAMINE-DEXTROAMPHET ER 10 MG PO CP24: 10.0000 mg | ORAL_CAPSULE | ORAL | 0 refills | Status: DC

## 2022-05-06 MED ORDER — AMPHETAMINE-DEXTROAMPHET ER 30 MG PO CP24: 30.0000 mg | ORAL_CAPSULE | ORAL | 0 refills | Status: DC

## 2022-05-06 MED ORDER — DIAZEPAM 5 MG PO TABS: 5.0000 mg | ORAL_TABLET | Freq: Three times a day (TID) | ORAL | 2 refills | Status: DC | PRN

## 2022-05-06 NOTE — Progress Notes (Signed)
Virtual Visit via Video Note  I connected with Nestor Lewandowsky on 05/06/22 at 11:40 AM EST by a video enabled telemedicine application and verified that I am speaking with the correct person using two identifiers.  Location: Patient: home Provider: office   I discussed the limitations of evaluation and management by telemedicine and the availability of in person appointments. The patient expressed understanding and agreed to proceed.    I discussed the assessment and treatment plan with the patient. The patient was provided an opportunity to ask questions and all were answered. The patient agreed with the plan and demonstrated an understanding of the instructions.   The patient was advised to call back or seek an in-person evaluation if the symptoms worsen or if the condition fails to improve as anticipated.  I provided 15 minutes of non-face-to-face time during this encounter.   [name], MD  [facility] MD/PA/NP OP Progress Note  05/06/2022 11:59 AM Nestor Lewandowsky  MRN:  [id number]  Chief Complaint:  Chief Complaint  Patient presents with   Depression   ADD   Follow-up   HPI: This patient is a 72 year old white female who is living with a boyfriend in [place].  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  The patient was referred by her PCP [name] for further evaluation of ADD anxiety depression and chronic fatigue.  The patient states that she has had significant health issues that date back to 2014.  She states at that time she was having a lot of groin pain and was diagnosed with with bilateral hernias.  She developed tendinopathy after surgeries for the hernias.  She has also had less than planter fasciitis and left knee pain.  Since then she still in a fair amount of pain but has also developed severe chronic fatigue and "brain fog."  This is also been accompanied by significant anxiety tachycardia and absolutely no energy to do anything.   She also has difficulty with sleeping.  The patient has tried numerous treatment modalities.  She was diagnosed with ADD at age 89 and has been on Adderall at varying doses ever since.  It helps to some degree.  She states that the anxiety she has can be debilitating.  She was recently prescribed Valium 5 mg which is "help more than anything else."  She still uses acupuncture but has tried numerous supplements and treatment modalities such as dry needling and massage.  She went to the [facility] clinic which she uses functional brain scanning to make diagnoses and psychiatry which has not been endorsed by the APA.  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  She is on Wellbutrin but is gradually decreasing the dose as that she does not feel it is helped that much.  At present she is somewhat depressed but primarily anxious.  She has difficulty sleeping although the Valium has helped.  She is only been on it about a week.  She takes Adderall XR 20 mg in the morning and sometimes supplements it was 10 or 20 more milligrams of regular Adderall through the day.  She states that she is "barely hanging on" at work because of difficulty focus and severe anxiety.  I suggested that we use a longer acting stimulant to help with the focus and she is agreeable.  The Valium is helped so this should be continued for now.  Rather than change everything it well so I  suggested we look at the antidepressant and how it is working at the next visit.   The patient returns for follow-up after 2 months.  She states she is doing about the same.  She has switched her primary doctor to Dr. Marvis Repress at [place].  She saw an infectious disease doctor here at [place] but he did not agree that she even has Lyme disease are certainly not Babesia.  She did not feel comfortable with his assessment so she is gone back to holistic clinic in [place].  They have prescribed amoxicillin  and doxycycline to treat the presumed Lyme disease.  She just started this.  She still having a lot of trouble with fatigue and brain fog.  She denies being significantly depressed and would like to slowly get off the Wellbutrin and I think this is reasonable.  She would like to go up a bit on the Adderall XR in the morning if she is having a bit more trouble focusing.  She still uses the Valium for anxiety and finds it helpful.  She denies thoughts of self-harm or suicide.  She and her boyfriend are getting along better.  She states that she has absolutely no energy to get outside or do much in terms of exercise. Visit Diagnosis:    ICD-10-CM   1. Attention deficit hyperactivity disorder (ADHD), combined type  F90.2     2. Generalized anxiety disorder  F41.1     3. Episodic mood disorder (HCC)  F39       Past Psychiatric History: Patient saw psychiatrist in [place] 7 years ago for treatment with Adderall for ADHD.  She was seen by psychiatrist at the [facility] clinic.  She has been looking for a therapist and has been in therapy on and off since high school    Past Medical History:  Past Medical History:  Diagnosis Date   Adductor tendinitis dx 2018   bilateral goin pain--- hx PRP treatment's 2019 @ [facility]   ADHD (attention deficit hyperactivity disorder)    Bilateral groin pain    Chronic fatigue 03/13/2022   Chronic insomnia    Chronic pain 03/13/2022   Chronic pain syndrome    secondary to trauma sport injury   Chronic pain syndrome    Confusion 03/13/2022   Fatigue    H/O cold sores    Plantar fasciitis of left foot    Seasonal affective disorder ([place]) 2013    Past Surgical History:  Procedure Laterality Date   LAPAROSCOPIC INGUINAL HERNIA REPAIR Bilateral 06-02-2015  in [place], [place]   w/ mesh   LAPAROSCOPIC TUBAL LIGATION Bilateral 10/19/2018   Procedure: LAPAROSCOPIC TUBAL LIGATION;  Surgeon: Arvella Nigh, MD;  Location: [facility];  Service: Gynecology;   Laterality: Bilateral;    Family Psychiatric History: See below  Family History:  Family History  Problem Relation Age of Onset   ADD / ADHD Mother    Anxiety disorder Father    Asthma Maternal Grandmother    Stroke Maternal Grandmother    Diabetes Maternal Grandmother     Social History:  Social History   Socioeconomic History   Marital status: Single    Spouse name: Not on file   Number of children: 0   Years of education: Not on file   Highest education level: Not on file  Occupational History    Employer: [employer]  Tobacco Use   Smoking status: Former    Packs/day: 0.50    Years: 5.00  Total pack years: 2.50    Types: Cigarettes    Quit date: 10/14/2011    Years since quitting: 10.5   Smokeless tobacco: Never  Vaping Use   Vaping Use: Never used  Substance and Sexual Activity   Alcohol use: Yes    Comment: 2-3 per day   Drug use: Not Currently    Types: Marijuana   Sexual activity: Yes    Birth control/protection: Surgical  Other Topics Concern   Not on file  Social History Narrative   Not on file   Social Determinants of Health   Financial Resource Strain: Not on file  Food Insecurity: Not on file  Transportation Needs: Not on file  Physical Activity: Not on file  Stress: Not on file  Social Connections: Not on file    Allergies: No Known Allergies  Metabolic Disorder Labs: No results found for: "HGBA1C", "MPG" No results found for: "PROLACTIN" No results found for: "CHOL", "TRIG", "HDL", "CHOLHDL", "VLDL", "LDLCALC" Lab Results  Component Value Date   TSH 1.314 03/26/2019    Therapeutic Level Labs: No results found for: "LITHIUM" No results found for: "VALPROATE" No results found for: "CBMZ"  Current Medications: Current Outpatient Medications  Medication Sig Dispense Refill   amphetamine-dextroamphetamine (ADDERALL XR) 10 MG 24 hr capsule Take 1 capsule (10 mg total) by mouth every morning. 30 capsule 0    amphetamine-dextroamphetamine (ADDERALL XR) 10 MG 24 hr capsule Take 1 capsule (10 mg total) by mouth every morning. 30 capsule 0   amphetamine-dextroamphetamine (ADDERALL XR) 30 MG 24 hr capsule Take 1 capsule (30 mg total) by mouth daily. 30 capsule 0   amphetamine-dextroamphetamine (ADDERALL XR) 30 MG 24 hr capsule Take 1 capsule (30 mg total) by mouth every morning. 30 capsule 0   amphetamine-dextroamphetamine (ADDERALL) 10 MG tablet Take 1 tablet (10 mg total) by mouth daily. 30 tablet 0   amphetamine-dextroamphetamine (ADDERALL) 10 MG tablet Take 1 tablet (10 mg total) by mouth daily. 30 tablet 0   buPROPion (WELLBUTRIN XL) 150 MG 24 hr tablet Take 1 tablet (150 mg total) by mouth every morning. 30 tablet 2   diazepam (VALIUM) 5 MG tablet Take 1 tablet (5 mg total) by mouth 3 (three) times daily as needed for anxiety. 90 tablet 2   ipratropium (ATROVENT) 0.03 % nasal spray Place 2 sprays into both nostrils every 12 (twelve) hours.     neomycin-colistin-hydrocortisone-thonzonium (CORTISPORIN TC) 3.06-15-08-0.5 MG/ML OTIC suspension 4 (four) times daily.     tretinoin (RETIN-A) 0.025 % cream Apply topically at bedtime.     No current facility-administered medications for this visit.     Musculoskeletal: Strength & Muscle Tone: within normal limits Gait & Station: normal Patient leans: N/A  Psychiatric Specialty Exam: Review of Systems  Constitutional:  Positive for fatigue.  Psychiatric/Behavioral:  Positive for decreased concentration. The patient is nervous/anxious.   All other systems reviewed and are negative.   There were no vitals taken for this visit.There is no height or weight on file to calculate BMI.  General Appearance: Casual, Neat, and Well Groomed  Eye Contact:  Good  Speech:  Clear and Coherent  Volume:  Normal  Mood:  Euthymic  Affect:  Congruent  Thought Process:  Goal Directed  Orientation:  Full (Time, Place, and Person)  Thought Content: Rumination    Suicidal Thoughts:  No  Homicidal Thoughts:  No  Memory:  Immediate;   Good Recent;   Good Remote;   NA  Judgement:  Good  Insight:  Good  Psychomotor Activity:  Decreased  Concentration:  Concentration: Fair and Attention Span: Fair  Recall:  Good  Fund of Knowledge: Good  Language: Good  Akathisia:  No  Handed:  Right  AIMS (if indicated): not done  Assets:  Communication Skills Desire for Improvement Resilience Social Support Talents/Skills Vocational/Educational  ADL's:  Intact  Cognition: WNL  Sleep:  Good   Screenings: GAD-7    Flowsheet Row Office Visit from 01/15/2018 in Primary Care at [facility]  Total GAD-7 Score 1      PHQ2-9    Flowsheet Row Office Visit from 03/13/2022 in [facility] Video Visit from 12/14/2021 in [facility] at [place] Video Visit from 11/16/2021 in [facility] at [place] Office Visit from 10/20/2021 in [facility] Office Visit from 02/19/2018 in Primary Care at [facility] Total Score 0 1 3 6 2   PHQ-9 Total Score -- -- 11 19 --      Flowsheet Row Video Visit from 12/14/2021 in [facility] at [place]  C-SSRS RISK CATEGORY No Risk        Assessment and Plan: This patient is a 32 year old female with a history of ADHD chronic fatigue chronic for pain depression anxiety.  Infectious disease does not think she has Lyme disease but the other clinic she attends seems to think so and she is now on new antibiotic protocol.  She is not focusing as well as she would like so we will increase Adderall XR to 40 mg in the morning and continue 10 mg daily for augmentation.  Over the next 2 months she will taper off Wellbutrin XL at her request.  She will continue Valium 5 mg 3 times daily as needed for anxiety.  She will return to see me in 2 months  Collaboration of Care: Collaboration  of Care: Primary Care Provider AEB notes will be shared with PCP at patient's request  Patient/Guardian was advised Release of Information must be obtained prior to any record release in order to collaborate their care with an outside provider. Patient/Guardian was advised if they have not already done so to contact the registration department to sign all necessary forms in order for 32 to release information regarding their care.   Consent: Patient/Guardian gives verbal consent for treatment and assignment of benefits for services provided during this visit. Patient/Guardian expressed understanding and agreed to proceed.    [place], MD 05/06/2022, 11:59 AM

## 2022-05-17 ENCOUNTER — Encounter: Payer: 59 | Admitting: Physical Medicine and Rehabilitation

## 2022-05-28 ENCOUNTER — Ambulatory Visit: Payer: 59 | Admitting: Neurology

## 2022-07-02 ENCOUNTER — Ambulatory Visit: Payer: 59 | Admitting: Neurology

## 2022-07-05 ENCOUNTER — Telehealth (INDEPENDENT_AMBULATORY_CARE_PROVIDER_SITE_OTHER): Payer: 59 | Admitting: Psychiatry

## 2022-07-05 ENCOUNTER — Encounter (HOSPITAL_COMMUNITY): Payer: Self-pay | Admitting: Psychiatry

## 2022-07-05 DIAGNOSIS — F411 Generalized anxiety disorder: Secondary | ICD-10-CM

## 2022-07-05 DIAGNOSIS — F902 Attention-deficit hyperactivity disorder, combined type: Secondary | ICD-10-CM | POA: Diagnosis not present

## 2022-07-05 DIAGNOSIS — F39 Unspecified mood [affective] disorder: Secondary | ICD-10-CM

## 2022-07-05 DIAGNOSIS — R5382 Chronic fatigue, unspecified: Secondary | ICD-10-CM | POA: Diagnosis not present

## 2022-07-05 MED ORDER — DIAZEPAM 5 MG PO TABS: 5.0000 mg | ORAL_TABLET | Freq: Three times a day (TID) | ORAL | 2 refills | Status: DC | PRN

## 2022-07-05 MED ORDER — AMPHETAMINE-DEXTROAMPHET ER 30 MG PO CP24: 30.0000 mg | ORAL_CAPSULE | Freq: Every day | ORAL | 0 refills | Status: DC

## 2022-07-05 MED ORDER — BUPROPION HCL ER (XL) 150 MG PO TB24: 150.0000 mg | ORAL_TABLET | ORAL | 2 refills | Status: DC

## 2022-07-05 MED ORDER — AMPHETAMINE-DEXTROAMPHET ER 10 MG PO CP24: 10.0000 mg | ORAL_CAPSULE | ORAL | 0 refills | Status: DC

## 2022-07-05 NOTE — Progress Notes (Signed)
Virtual Visit via Video Note  I connected with Erin Pierce on 07/05/22 at 11:20 AM EDT by a video enabled telemedicine application and verified that I am speaking with the correct person using two identifiers.  Location: Patient: home Provider: office   I discussed the limitations of evaluation and management by telemedicine and the availability of in person appointments. The patient expressed understanding and agreed to proceed.     I discussed the assessment and treatment plan with the patient. The patient was provided an opportunity to ask questions and all were answered. The patient agreed with the plan and demonstrated an understanding of the instructions.   The patient was advised to call back or seek an in-person evaluation if the symptoms worsen or if the condition fails to improve as anticipated.  I provided 20 minutes of non-face-to-face time during this encounter.   Levonne Spiller, MD  [facility] MD/PA/NP OP Progress Note  07/05/2022 11:42 AM Erin Pierce  MRN:  [id number]  Chief Complaint:  Chief Complaint  Patient presents with   Depression   Anxiety   ADD   Follow-up   HPI: This patient is a 32 year old white female who is living with a boyfriend in [place].  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  The patient was referred by her PCP Dr. Vela Prose for further evaluation of ADD anxiety depression and chronic fatigue.  The patient states that she has had significant health issues that date back to 2014.  She states at that time she was having a lot of groin pain and was diagnosed with with bilateral hernias.  She developed tendinopathy after surgeries for the hernias.  She has also had less than planter fasciitis and left knee pain.  Since then she still in a fair amount of pain but has also developed severe chronic fatigue and "brain fog."  This is also been accompanied by significant anxiety tachycardia and absolutely no energy to do  anything.  She also has difficulty with sleeping.  The patient has tried numerous treatment modalities.  She was diagnosed with ADD at age 30 and has been on Adderall at varying doses ever since.  It helps to some degree.  She states that the anxiety she has can be debilitating.  She was recently prescribed Valium 5 mg which is "help more than anything else."  She still uses acupuncture but has tried numerous supplements and treatment modalities such as dry needling and massage.  She went to the [facility] clinic which she uses functional brain scanning to make diagnoses and psychiatry which has not been endorsed by the APA.  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  She is on Wellbutrin but is gradually decreasing the dose as that she does not feel it is helped that much.  At present she is somewhat depressed but primarily anxious.  She has difficulty sleeping although the Valium has helped.  She is only been on it about a week.  She takes Adderall XR 20 mg in the morning and sometimes supplements it was 10 or 20 more milligrams of regular Adderall through the day.  She states that she is "barely hanging on" at work because of difficulty focus and severe anxiety.  I suggested that we use a longer acting stimulant to help with the focus and she is agreeable.  The Valium is helped so this should be continued for now.  Rather than change everything  it well so I suggested we look at the antidepressant and how it is working at the next visit.   The patient and her mother return for follow-up after 2 months.  The patient states that she is feeling worse in a lot of ways.  She is still going to the holistic clinic and went through a course of antibiotics which made her feel very sick and fatigued.  She is now receiving ozone therapy twice a week as well as taking nystatin.  This is starting to help a little bit as her energy is slowly coming back.  However whenever she  tries to work she gets very fatigued but worn out and unable to function.  She does have FMLA to take 6 days off per month but it just is not enough.  She feels like she needs a extended amount of time off to recuperate.  I told her I be willing to fill out his short-term disability forms for her.  In terms of medication she still thinks it is helping with her focus and mood.  She would like to keep trying all the alternative modalities to see if something will finally help. Visit Diagnosis:    ICD-10-CM   1. Chronic fatigue  R53.82     2. Episodic mood disorder (HCC)  F39     3. Generalized anxiety disorder  F41.1     4. Attention deficit hyperactivity disorder (ADHD), combined type  F90.2       Past Psychiatric History: Patient saw psychiatrist in [place] 7 years ago for treatment with Adderall for ADHD.  She was seen by psychiatrist at the [facility] clinic.  She has been looking for a therapist and has been in therapy on and off since high school    Past Medical History:  Past Medical History:  Diagnosis Date   Adductor tendinitis dx 2018   bilateral goin pain--- hx PRP treatment's 2019 @ [facility]   ADHD (attention deficit hyperactivity disorder)    Bilateral groin pain    Chronic fatigue 03/13/2022   Chronic insomnia    Chronic pain 03/13/2022   Chronic pain syndrome    secondary to trauma sport injury   Chronic pain syndrome    Confusion 03/13/2022   Fatigue    H/O cold sores    Plantar fasciitis of left foot    Seasonal affective disorder ([place]) 2013    Past Surgical History:  Procedure Laterality Date   LAPAROSCOPIC INGUINAL HERNIA REPAIR Bilateral 06-02-2015  in [place], [place]   w/ mesh   LAPAROSCOPIC TUBAL LIGATION Bilateral 10/19/2018   Procedure: LAPAROSCOPIC TUBAL LIGATION;  Surgeon: Arvella Nigh, MD;  Location: [facility];  Service: Gynecology;  Laterality: Bilateral;    Family Psychiatric History: See below  Family History:  Family History   Problem Relation Age of Onset   ADD / ADHD Mother    Anxiety disorder Father    Asthma Maternal Grandmother    Stroke Maternal Grandmother    Diabetes Maternal Grandmother     Social History:  Social History   Socioeconomic History   Marital status: Single    Spouse name: Not on file   Number of children: 0   Years of education: Not on file   Highest education level: Not on file  Occupational History    Employer: [employer]  Tobacco Use   Smoking status: Former    Packs/day: 0.50    Years: 5.00    Additional pack years: 0.00  Total pack years: 2.50    Types: Cigarettes    Quit date: 10/14/2011    Years since quitting: 10.7   Smokeless tobacco: Never  Vaping Use   Vaping Use: Never used  Substance and Sexual Activity   Alcohol use: Yes    Comment: 2-3 per day   Drug use: Not Currently    Types: Marijuana   Sexual activity: Yes    Birth control/protection: Surgical  Other Topics Concern   Not on file  Social History Narrative   Not on file   Social Determinants of Health   Financial Resource Strain: Not on file  Food Insecurity: Not on file  Transportation Needs: Not on file  Physical Activity: Not on file  Stress: Not on file  Social Connections: Not on file    Allergies: No Known Allergies  Metabolic Disorder Labs: No results found for: "HGBA1C", "MPG" No results found for: "PROLACTIN" No results found for: "CHOL", "TRIG", "HDL", "CHOLHDL", "VLDL", "LDLCALC" Lab Results  Component Value Date   TSH 1.314 03/26/2019    Therapeutic Level Labs: No results found for: "LITHIUM" No results found for: "VALPROATE" No results found for: "CBMZ"  Current Medications: Current Outpatient Medications  Medication Sig Dispense Refill   amphetamine-dextroamphetamine (ADDERALL XR) 10 MG 24 hr capsule Take 1 capsule (10 mg total) by mouth every morning. 30 capsule 0   amphetamine-dextroamphetamine (ADDERALL XR) 10 MG 24 hr capsule Take 1 capsule (10 mg  total) by mouth every morning. 30 capsule 0   amphetamine-dextroamphetamine (ADDERALL XR) 30 MG 24 hr capsule Take 1 capsule (30 mg total) by mouth every morning. 30 capsule 0   amphetamine-dextroamphetamine (ADDERALL XR) 30 MG 24 hr capsule Take 1 capsule (30 mg total) by mouth daily. 30 capsule 0   amphetamine-dextroamphetamine (ADDERALL) 10 MG tablet Take 1 tablet (10 mg total) by mouth daily. 30 tablet 0   amphetamine-dextroamphetamine (ADDERALL) 10 MG tablet Take 1 tablet (10 mg total) by mouth daily. 30 tablet 0   buPROPion (WELLBUTRIN XL) 150 MG 24 hr tablet Take 1 tablet (150 mg total) by mouth every morning. 30 tablet 2   diazepam (VALIUM) 5 MG tablet Take 1 tablet (5 mg total) by mouth 3 (three) times daily as needed for anxiety. 90 tablet 2   ipratropium (ATROVENT) 0.03 % nasal spray Place 2 sprays into both nostrils every 12 (twelve) hours.     neomycin-colistin-hydrocortisone-thonzonium (CORTISPORIN TC) 3.06-15-08-0.5 MG/ML OTIC suspension 4 (four) times daily.     tretinoin (RETIN-A) 0.025 % cream Apply topically at bedtime.     No current facility-administered medications for this visit.     Musculoskeletal: Strength & Muscle Tone: within normal limits Gait & Station: normal Patient leans: N/A  Psychiatric Specialty Exam: Review of Systems  Constitutional:  Positive for fatigue and unexpected weight change.  Musculoskeletal:  Positive for myalgias.  Psychiatric/Behavioral:  Positive for decreased concentration. The patient is nervous/anxious.   All other systems reviewed and are negative.   There were no vitals taken for this visit.There is no height or weight on file to calculate BMI.  General Appearance: Casual, Neat, and Well Groomed  Eye Contact:  Good  Speech:  Clear and Coherent  Volume:  Normal  Mood:  Anxious  Affect:  Congruent  Thought Process:  Goal Directed  Orientation:  Full (Time, Place, and Person)  Thought Content: Rumination   Suicidal Thoughts:  No   Homicidal Thoughts:  No  Memory:  Immediate;   Good Recent;  Good Remote;   Good  Judgement:  Good  Insight:  Good  Psychomotor Activity:  Decreased  Concentration:  Concentration: Fair and Attention Span: Fair  Recall:  Good  Fund of Knowledge: Good  Language: Good  Akathisia:  No  Handed:  Right  AIMS (if indicated): not done  Assets:  Communication Skills Desire for Improvement Resilience Social Support Talents/Skills  ADL's:  Intact  Cognition: WNL  Sleep:  Good   Screenings: GAD-7    Flowsheet Row Office Visit from 01/15/2018 in [facility] at [place]  Total GAD-7 Score 1      PHQ2-9    [place] Visit from 03/13/2022 in [facility] Video Visit from 12/14/2021 in [facility] Video Visit from 11/16/2021 in [place] [facility] Visit from 10/20/2021 in [facility] Office Visit from 02/19/2018 in [facility] at [facility] Total Score 0 1 3 6 2   PHQ-9 Total Score -- -- 11 19 --      Flowsheet Row Video Visit from 12/14/2021 in [facility] at [place] No Risk        Assessment and Plan: This patient is a 32 year old female with a history of ADHD chronic fatigue chronic pain depression and anxiety.  She is pursuing alternative treatments for the brain fog fatigue etc.  I told patient and mom to be very careful about these because many of them are not FDA approved.  Nevertheless they are going to continue with this.  For now she will continue Adderall XR 40 mg in the morning and 10 mg later in the day for augmentation for ADHD, Wellbutrin XL 150 mg daily for depression and Valium 5 mg 3 times daily as needed for anxiety.  She will return to see me in 4 weeks.  Given her amount of fatigue and inability to function I am happy to fill out short-term  disability paperwork for her  Collaboration of Care: Collaboration of Care: Primary Care Provider AEB notes will be shared with PCP at patient's request  Patient/Guardian was advised Release of Information must be obtained prior to any record release in order to collaborate their care with an outside provider. Patient/Guardian was advised if they have not already done so to contact the registration department to sign all necessary forms in order for [place] to release information regarding their care.   Consent: Patient/Guardian gives verbal consent for treatment and assignment of benefits for services provided during this visit. Patient/Guardian expressed understanding and agreed to proceed.    Levonne Spiller, MD 07/05/2022, 11:42 AM

## 2022-07-08 ENCOUNTER — Encounter: Payer: 59 | Admitting: Physical Medicine and Rehabilitation

## 2022-07-11 ENCOUNTER — Encounter (HOSPITAL_COMMUNITY): Payer: Self-pay

## 2022-07-11 ENCOUNTER — Telehealth (HOSPITAL_COMMUNITY): Payer: Self-pay

## 2022-07-11 NOTE — Telephone Encounter (Signed)
Forms and release placed in provider box

## 2022-07-11 NOTE — Telephone Encounter (Signed)
Erin Pierce with [employer] called in to verify that we received fax for disability form for pt. Advised that we received it yesterday 07/10/22. Verified pt's last appt 07/05/22 and next appt 08/02/22. Verified pt medication and dx

## 2022-08-02 ENCOUNTER — Encounter (HOSPITAL_COMMUNITY): Payer: Self-pay | Admitting: Psychiatry

## 2022-08-02 ENCOUNTER — Telehealth (INDEPENDENT_AMBULATORY_CARE_PROVIDER_SITE_OTHER): Payer: 59 | Admitting: Psychiatry

## 2022-08-02 DIAGNOSIS — F411 Generalized anxiety disorder: Secondary | ICD-10-CM

## 2022-08-02 DIAGNOSIS — R5382 Chronic fatigue, unspecified: Secondary | ICD-10-CM | POA: Diagnosis not present

## 2022-08-02 DIAGNOSIS — F902 Attention-deficit hyperactivity disorder, combined type: Secondary | ICD-10-CM | POA: Diagnosis not present

## 2022-08-02 DIAGNOSIS — F39 Unspecified mood [affective] disorder: Secondary | ICD-10-CM

## 2022-08-02 MED ORDER — DIAZEPAM 5 MG PO TABS: 5.0000 mg | ORAL_TABLET | Freq: Three times a day (TID) | ORAL | 2 refills | Status: DC | PRN

## 2022-08-02 MED ORDER — BUPROPION HCL 75 MG PO TABS: 75.0000 mg | ORAL_TABLET | ORAL | 2 refills | Status: DC

## 2022-08-02 MED ORDER — AMPHETAMINE-DEXTROAMPHET ER 30 MG PO CP24: 30.0000 mg | ORAL_CAPSULE | Freq: Every day | ORAL | 0 refills | Status: DC

## 2022-08-02 NOTE — Progress Notes (Signed)
Virtual Visit via Video Note  I connected with Erin Pierce on 08/02/22 at 11:20 AM EDT by a video enabled telemedicine application and verified that I am speaking with the correct person using two identifiers.  Location: Patient: home Provider: office   I discussed the limitations of evaluation and management by telemedicine and the availability of in person appointments. The patient expressed understanding and agreed to proceed.     I discussed the assessment and treatment plan with the patient. The patient was provided an opportunity to ask questions and all were answered. The patient agreed with the plan and demonstrated an understanding of the instructions.   The patient was advised to call back or seek an in-person evaluation if the symptoms worsen or if the condition fails to improve as anticipated.  I provided 15 minutes of non-face-to-face time during this encounter.   Erin Ruder, MD  [facility] MD/PA/NP OP Progress Note  08/02/2022 11:41 AM Erin Pierce  MRN:  [id number]  Chief Complaint:  Chief Complaint  Patient presents with   Depression   ADHD   Follow-up   Fatigue   HPI: This patient is a 32 year old white female who is living with a boyfriend in [place].  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  The patient was referred by her PCP [name] for further evaluation of ADD anxiety depression and chronic fatigue.  The patient states that she has had significant health issues that date back to 2014.  She states at that time she was having a lot of groin pain and was diagnosed with with bilateral hernias.  She developed tendinopathy after surgeries for the hernias.  She has also had less than planter fasciitis and left knee pain.  Since then she still in a fair amount of pain but has also developed severe chronic fatigue and "brain fog."  This is also been accompanied by significant anxiety tachycardia and absolutely no energy to do  anything.  She also has difficulty with sleeping.  The patient has tried numerous treatment modalities.  She was diagnosed with ADD at age 32 and has been on Adderall at varying doses ever since.  It helps to some degree.  She states that the anxiety she has can be debilitating.  She was recently prescribed Valium 5 mg which is "help more than anything else."  She still uses acupuncture but has tried numerous supplements and treatment modalities such as dry needling and massage.  She went to the [facility] clinic which she uses functional brain scanning to make diagnoses and psychiatry which has not been endorsed by the APA.  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  She is on Wellbutrin but is gradually decreasing the dose as that she does not feel it is helped that much.  At present she is somewhat depressed but primarily anxious.  She has difficulty sleeping although the Valium has helped.  She is only been on it about a week.  She takes Adderall XR 20 mg in the morning and sometimes supplements it was 10 or 20 more milligrams of regular Adderall through the day.  She states that she is "barely hanging on" at work because of difficulty focus and severe anxiety.  I suggested that we use a longer acting stimulant to help with the focus and she is agreeable.  The Valium is helped so this should be continued for now.  Rather than change everything  it well so I suggested we look at the antidepressant and how it is working at the next visit.   The returns for follow-up after 4 weeks.  Last time she felt very low and depleted her energy was at an all-time low.  I did fill out paperwork for her to take FMLA for 6 weeks.  She is slated to go back on May 24.  She states that the time off has helped some but not as much as she had hoped.  Her energy is slowly getting better but she has numerous days where she cannot do much of anything.  She states that the days that she  has skipped the Wellbutrin she has felt better and would like to taper off it so we will cut it back to 75 mg.  She is still working with holistic medicine and getting ozone and night statin treatments which she feels are helpful.  Her mood has been generally fairly stable but still dealing with anxiety and severe fatigue at times. Visit Diagnosis:    ICD-10-CM   1. Chronic fatigue  R53.82     2. Episodic mood disorder  F39     3. Generalized anxiety disorder  F41.1     4. Attention deficit hyperactivity disorder (ADHD), combined type  F90.2       Past Psychiatric History: Patient saw psychiatrist in [place] 7 years ago for treatment with Adderall for ADHD.  She was seen by psychiatrist at the [facility] clinic.  She has been looking for a therapist and has been in therapy on and off since high school    Past Medical History:  Past Medical History:  Diagnosis Date   Adductor tendinitis dx 2018   bilateral goin pain--- hx PRP treatment's 2019 @ [facility]   ADHD (attention deficit hyperactivity disorder)    Bilateral groin pain    Chronic fatigue 03/13/2022   Chronic insomnia    Chronic pain 03/13/2022   Chronic pain syndrome    secondary to trauma sport injury   Chronic pain syndrome    Confusion 03/13/2022   Fatigue    H/O cold sores    Plantar fasciitis of left foot    Seasonal affective disorder 2013    Past Surgical History:  Procedure Laterality Date   LAPAROSCOPIC INGUINAL HERNIA REPAIR Bilateral 06-02-2015  in [place], [place]   w/ mesh   LAPAROSCOPIC TUBAL LIGATION Bilateral 10/19/2018   Procedure: LAPAROSCOPIC TUBAL LIGATION;  Surgeon: Richardean Chimera, MD;  Location: [facility] [place];  Service: Gynecology;  Laterality: Bilateral;    Family Psychiatric History: See below  Family History:  Family History  Problem Relation Age of Onset   ADD / ADHD Mother    Anxiety disorder Father    Asthma Maternal Grandmother    Stroke Maternal Grandmother    Diabetes  Maternal Grandmother     Social History:  Social History   Socioeconomic History   Marital status: Single    Spouse name: Not on file   Number of children: 0   Years of education: Not on file   Highest education level: Not on file  Occupational History    Employer: [employer]  Tobacco Use   Smoking status: Former    Packs/day: 0.50    Years: 5.00    Additional pack years: 0.00    Total pack years: 2.50    Types: Cigarettes    Quit date: 10/14/2011    Years since quitting: 10.8   Smokeless tobacco: Never  Vaping Use   Vaping Use: Never used  Substance and Sexual Activity   Alcohol use: Yes    Comment: 2-3 per day   Drug use: Not Currently    Types: Marijuana   Sexual activity: Yes    Birth control/protection: Surgical  Other Topics Concern   Not on file  Social History Narrative   Not on file   Social Determinants of Health   Financial Resource Strain: Not on file  Food Insecurity: Not on file  Transportation Needs: Not on file  Physical Activity: Not on file  Stress: Not on file  Social Connections: Not on file    Allergies: No Known Allergies  Metabolic Disorder Labs: No results found for: "HGBA1C", "MPG" No results found for: "PROLACTIN" No results found for: "CHOL", "TRIG", "HDL", "CHOLHDL", "VLDL", "LDLCALC" Lab Results  Component Value Date   TSH 1.314 03/26/2019    Therapeutic Level Labs: No results found for: "LITHIUM" No results found for: "VALPROATE" No results found for: "CBMZ"  Current Medications: Current Outpatient Medications  Medication Sig Dispense Refill   buPROPion (WELLBUTRIN) 75 MG tablet Take 1 tablet (75 mg total) by mouth every morning. 30 tablet 2   amphetamine-dextroamphetamine (ADDERALL XR) 10 MG 24 hr capsule Take 1 capsule (10 mg total) by mouth every morning. 30 capsule 0   amphetamine-dextroamphetamine (ADDERALL XR) 10 MG 24 hr capsule Take 1 capsule (10 mg total) by mouth every morning. 30 capsule 0    amphetamine-dextroamphetamine (ADDERALL XR) 30 MG 24 hr capsule Take 1 capsule (30 mg total) by mouth every morning. 30 capsule 0   amphetamine-dextroamphetamine (ADDERALL XR) 30 MG 24 hr capsule Take 1 capsule (30 mg total) by mouth daily. 30 capsule 0   amphetamine-dextroamphetamine (ADDERALL) 10 MG tablet Take 1 tablet (10 mg total) by mouth daily. 30 tablet 0   amphetamine-dextroamphetamine (ADDERALL) 10 MG tablet Take 1 tablet (10 mg total) by mouth daily. 30 tablet 0   diazepam (VALIUM) 5 MG tablet Take 1 tablet (5 mg total) by mouth 3 (three) times daily as needed for anxiety. 90 tablet 2   ipratropium (ATROVENT) 0.03 % nasal spray Place 2 sprays into both nostrils every 12 (twelve) hours.     neomycin-colistin-hydrocortisone-thonzonium (CORTISPORIN TC) 3.06-15-08-0.5 MG/ML OTIC suspension 4 (four) times daily.     tretinoin (RETIN-A) 0.025 % cream Apply topically at bedtime.     No current facility-administered medications for this visit.     Musculoskeletal: Strength & Muscle Tone: within normal limits Gait & Station: normal Patient leans: N/A  Psychiatric Specialty Exam: Review of Systems  Constitutional:  Positive for fatigue.  Psychiatric/Behavioral:  The patient is nervous/anxious.   All other systems reviewed and are negative.   There were no vitals taken for this visit.There is no height or weight on file to calculate BMI.  General Appearance: Casual and Fairly Groomed  Eye Contact:  Good  Speech:  Clear and Coherent  Volume:  Normal  Mood:  Anxious  Affect:  Congruent  Thought Process:  Goal Directed  Orientation:  Full (Time, Place, and Person)  Thought Content: Rumination   Suicidal Thoughts:  No  Homicidal Thoughts:  No  Memory:  Immediate;   Good Recent;   Good Remote;   Good  Judgement:  Good  Insight:  Good  Psychomotor Activity:  Decreased  Concentration:  Concentration: Fair and Attention Span: Fair  Recall:  Good  Fund of Knowledge: Good  Language:  Good  Akathisia:  No  Handed:  Right  AIMS (if indicated): not done  Assets:  Communication Skills Desire for Improvement Resilience Social Support  ADL's:  Intact  Cognition: WNL  Sleep:  Good   Screenings: GAD-7    Flowsheet Row Office Visit from 01/15/2018 in Primary Care at [place]  Total GAD-7 Score 1      PHQ2-9    Flowsheet Row Office Visit from 03/13/2022 in [facility] for Infectious Disease Video Visit from 12/14/2021 in [facility] at [place] Video Visit from 11/16/2021 in [facility] at [place] Office Visit from [facility]/20[facility] Office Visit from 02/19/2018 in Primary Care at [facility] Total Score 0 1 3 6 2   PHQ-9 Total Score -- -- 11 19 --      Flowsheet Row Video Visit from 12/14/2021 in [facility] at [place]  C-SSRS RISK CATEGORY No Risk        Assessment and Plan: This patient is a 32 year old female with a history of ADHD chronic fatigue chronic pain depression and anxiety.  She does feel better which she claims on less Wellbutrin so we will cut it down to 75 mg daily.  For now she will continue Adderall XR 30 mg every morning for focus although she would rather not take the afternoon dosage as it interferes with sleep.  She will continue Valium 5 mg 3 times daily as needed for anxiety.  She will return to see me in 4 weeks.  She is now slated to return to work on May 24 and we will see if she is ready.  Collaboration of Care: Collaboration of Care: Primary Care Provider AEB notes will be shared with PCP at patient's request  Patient/Guardian was advised Release of Information must be obtained prior to any record release in order to collaborate their care with an outside provider. Patient/Guardian was advised if they have not already done so to contact the registration department to sign all necessary forms  in order for [place] to release information regarding their care.   Consent: Patient/Guardian gives verbal consent for treatment and assignment of benefits for services provided during this visit. Patient/Guardian expressed understanding and agreed to proceed.    Erin Ruder, MD 08/02/2022, 11:41 AM

## 2022-08-23 ENCOUNTER — Telehealth (HOSPITAL_COMMUNITY): Payer: Self-pay | Admitting: *Deleted

## 2022-08-23 NOTE — Telephone Encounter (Signed)
Form received from [facility] for patient Disability. Was asked by provider to please let patient know that the dates on the forms are not correct and to ask patient for the correct dates. Dates on the form states from July 15, 2022 to April 30th as the time period requested.    Patient called office left message stating that the dates should be from May 28th to June 5th with a return to work June 6th.    For will be placed in providers box to complete.

## 2022-09-01 ENCOUNTER — Other Ambulatory Visit (HOSPITAL_COMMUNITY): Payer: Self-pay | Admitting: Psychiatry

## 2022-09-12 ENCOUNTER — Telehealth (HOSPITAL_COMMUNITY): Payer: 59 | Admitting: Psychiatry

## 2022-09-13 ENCOUNTER — Ambulatory Visit: Payer: 59 | Admitting: Physical Medicine and Rehabilitation

## 2022-09-16 ENCOUNTER — Encounter (HOSPITAL_COMMUNITY): Payer: Self-pay | Admitting: Psychiatry

## 2022-09-16 ENCOUNTER — Telehealth (INDEPENDENT_AMBULATORY_CARE_PROVIDER_SITE_OTHER): Payer: 59 | Admitting: Psychiatry

## 2022-09-16 DIAGNOSIS — R5382 Chronic fatigue, unspecified: Secondary | ICD-10-CM | POA: Diagnosis not present

## 2022-09-16 DIAGNOSIS — F902 Attention-deficit hyperactivity disorder, combined type: Secondary | ICD-10-CM | POA: Diagnosis not present

## 2022-09-16 DIAGNOSIS — F411 Generalized anxiety disorder: Secondary | ICD-10-CM | POA: Diagnosis not present

## 2022-09-16 DIAGNOSIS — F39 Unspecified mood [affective] disorder: Secondary | ICD-10-CM | POA: Diagnosis not present

## 2022-09-16 MED ORDER — AMPHETAMINE-DEXTROAMPHET ER 20 MG PO CP24: 20.0000 mg | ORAL_CAPSULE | Freq: Every day | ORAL | 0 refills | Status: DC

## 2022-09-16 MED ORDER — DIAZEPAM 5 MG PO TABS: 5.0000 mg | ORAL_TABLET | Freq: Three times a day (TID) | ORAL | 2 refills | Status: DC | PRN

## 2022-09-16 MED ORDER — BELSOMRA 10 MG PO TABS: 10.0000 mg | ORAL_TABLET | Freq: Every day | ORAL | 2 refills | Status: DC

## 2022-09-16 NOTE — Progress Notes (Signed)
Virtual Visit via Video Note  I connected with Erin Pierce on 09/16/22 at  1:00 PM EDT by a video enabled telemedicine application and verified that I am speaking with the correct person using two identifiers.  Location: Patient: home Provider: office   I discussed the limitations of evaluation and management by telemedicine and the availability of in person appointments. The patient expressed understanding and agreed to proceed.     I discussed the assessment and treatment plan with the patient. The patient was provided an opportunity to ask questions and all were answered. The patient agreed with the plan and demonstrated an understanding of the instructions.   The patient was advised to call back or seek an in-person evaluation if the symptoms worsen or if the condition fails to improve as anticipated.  I provided 20 minutes of non-face-to-face time during this encounter.   [name], MD  [facility] MD/PA/NP OP Progress Note  09/16/2022 1:28 PM Erin Pierce  MRN:  [id number]  Chief Complaint:  Chief Complaint  Patient presents with   Depression   Anxiety   Fatigue   ADD   Follow-up   HPI: This patient is a 32 year old white female who is living with a boyfriend in [place].  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  The patient and mother return for follow-up regarding her chronic fatigue severe anxiety depression and insomnia as well as ADD.  She is still on short-term disability from her job.  She does not feel ready to go back.  She is slated to go back in 3 days but has severe fatigue a lot of anxiety problems sleeping at night with racing thoughts.  She is trying to get off as many medicines as she can.  However I did suggest we try something for sleep such as Belsomra which has less side effects.  She is tapering herself off the Wellbutrin.  She would like to cut the Adderall XR down to 20 mg.  Her  primary doctor who does alternative medicine that has put her on itraconazole because of the results of her "yeast test."  She states that the medicine is making her feel bad such as hot and cold all the time.  She does not feel like she can return to work as she can barely function through the day and has absolutely no energy.  I agreed to fill out more paperwork for her to continue her short-term disability at this time.  She wants to get better and denies any thoughts of self-harm or suicidal ideation Visit Diagnosis:    ICD-10-CM   1. Chronic fatigue  R53.82     2. Episodic mood disorder (HCC)  F39     3. Generalized anxiety disorder  F41.1     4. Attention deficit hyperactivity disorder (ADHD), combined type  F90.2       Past Psychiatric History: Patient saw psychiatrist in [place] 7 years ago for treatment with Adderall for ADHD.  She was seen by psychiatrist at the [facility] clinic.  She has been looking for a therapist and has been in therapy on and off since high school    Past Medical History:  Past Medical History:  Diagnosis Date   Adductor tendinitis dx 2018   bilateral goin pain--- hx PRP treatment's 2019 @ [facility]   ADHD (attention deficit hyperactivity disorder)    Bilateral groin pain    Chronic fatigue 03/13/2022  Chronic insomnia    Chronic pain 03/13/2022   Chronic pain syndrome    secondary to trauma sport injury   Chronic pain syndrome    Confusion 03/13/2022   Fatigue    H/O cold sores    Plantar fasciitis of left foot    Seasonal affective disorder (HCC) 2013    Past Surgical History:  Procedure Laterality Date   LAPAROSCOPIC INGUINAL HERNIA REPAIR Bilateral 06-02-2015  in [place], [place]   w/ mesh   LAPAROSCOPIC TUBAL LIGATION Bilateral 10/19/2018   Procedure: LAPAROSCOPIC TUBAL LIGATION;  Surgeon: Richardean Chimera, MD;  Location: [facility];  Service: Gynecology;  Laterality: Bilateral;    Family Psychiatric History: See below  Family  History:  Family History  Problem Relation Age of Onset   ADD / ADHD Mother    Anxiety disorder Father    Asthma Maternal Grandmother    Stroke Maternal Grandmother    Diabetes Maternal Grandmother     Social History:  Social History   Socioeconomic History   Marital status: Single    Spouse name: Not on file   Number of children: 0   Years of education: Not on file   Highest education level: Not on file  Occupational History    Employer: [employer]  Tobacco Use   Smoking status: Former    Packs/day: 0.50    Years: 5.00    Additional pack years: 0.00    Total pack years: 2.50    Types: Cigarettes    Quit date: 10/14/2011    Years since quitting: 10.9   Smokeless tobacco: Never  Vaping Use   Vaping Use: Never used  Substance and Sexual Activity   Alcohol use: Yes    Comment: 2-3 per day   Drug use: Not Currently    Types: Marijuana   Sexual activity: Yes    Birth control/protection: Surgical  Other Topics Concern   Not on file  Social History Narrative   Not on file   Social Determinants of Health   Financial Resource Strain: Not on file  Food Insecurity: Not on file  Transportation Needs: Not on file  Physical Activity: Not on file  Stress: Not on file  Social Connections: Not on file    Allergies: No Known Allergies  Metabolic Disorder Labs: No results found for: "HGBA1C", "MPG" No results found for: "PROLACTIN" No results found for: "CHOL", "TRIG", "HDL", "CHOLHDL", "VLDL", "LDLCALC" Lab Results  Component Value Date   TSH 1.314 03/26/2019    Therapeutic Level Labs: No results found for: "LITHIUM" No results found for: "VALPROATE" No results found for: "CBMZ"  Current Medications: Current Outpatient Medications  Medication Sig Dispense Refill   amphetamine-dextroamphetamine (ADDERALL XR) 20 MG 24 hr capsule Take 1 capsule (20 mg total) by mouth daily. 30 capsule 0   Suvorexant (BELSOMRA) 10 MG TABS Take 1 tablet (10 mg total) by mouth  at bedtime. 30 tablet 2   buPROPion (WELLBUTRIN) 75 MG tablet TAKE 1 TABLET BY MOUTH EVERY MORNING. 90 tablet 1   diazepam (VALIUM) 5 MG tablet Take 1 tablet (5 mg total) by mouth 3 (three) times daily as needed for anxiety. 90 tablet 2   ipratropium (ATROVENT) 0.03 % nasal spray Place 2 sprays into both nostrils every 12 (twelve) hours.     neomycin-colistin-hydrocortisone-thonzonium (CORTISPORIN TC) 3.06-15-08-0.5 MG/ML OTIC suspension 4 (four) times daily.     tretinoin (RETIN-A) 0.025 % cream Apply topically at bedtime.     No current facility-administered medications for  this visit.     Musculoskeletal: Strength & Muscle Tone: within normal limits Gait & Station: normal Patient leans: N/A  Psychiatric Specialty Exam: Review of Systems  Constitutional:  Positive for fatigue.  Neurological:  Positive for weakness.  Psychiatric/Behavioral:  Positive for sleep disturbance. The patient is nervous/anxious.   All other systems reviewed and are negative.   There were no vitals taken for this visit.There is no height or weight on file to calculate BMI.  General Appearance: Casual and Fairly Groomed  Eye Contact:  Good  Speech:  Clear and Coherent  Volume:  Normal  Mood:  Anxious  Affect:  Congruent  Thought Process:  Goal Directed  Orientation:  Full (Time, Place, and Person)  Thought Content: Rumination   Suicidal Thoughts:  No  Homicidal Thoughts:  No  Memory:  Immediate;   Good Recent;   Good Remote;   Good  Judgement:  Good  Insight:  Good  Psychomotor Activity:  Decreased  Concentration:  Concentration: Fair and Attention Span: Fair  Recall:  Good  Fund of Knowledge: Good  Language: Good  Akathisia:  No  Handed:  Right  AIMS (if indicated): not done  Assets:  Communication Skills Desire for Improvement Resilience Social Support Talents/Skills Vocational/Educational  ADL's:  Intact  Cognition: WNL  Sleep:  Poor   Screenings: GAD-7    Flowsheet Row Office  Visit from 01/15/2018 in Primary Care at [place]  Total GAD-7 Score 1      PHQ2-9    Flowsheet Row Office Visit from 03/13/2022 in [facility] for Infectious Disease Video Visit from 12/14/2021 in [facility]patient [facility]al Health at [place] Video Visit from 11/16/2021 in [facility] at [place] Office Visit from 10/20/2021 in [facility] Office Visit from 02/19/2018 in Primary Care at [facility] Total Score 0 1 3 6 2   PHQ-9 Total Score -- -- 11 19 --      Flowsheet Row Video Visit from 12/14/2021 in [facility] at [place]  C-SSRS RISK CATEGORY No Risk        Assessment and Plan: This patient is a 32 year old female with a history of ADHD chronic fatigue chronic pain depression and anxiety.  She is not sleeping well.  She has the idea that she wants to get off as many medicines as possible so she is getting herself off Wellbutrin and we are cutting Adderall XR down to 20 mg every morning for focus.  She will continue Valium 5 mg 3 times daily as needed for anxiety and added Belsomra 10 mg at bedtime for sleep.  She will return to see me in 4 weeks.  I have agreed to file paperwork to get her an extension on her short-term disability as she is unable to function by her report  Collaboration of Care: Collaboration of Care: Primary Care Provider AEB notes will be shared with PCP at patient's request  Patient/Guardian was advised Release of Information must be obtained prior to any record release in order to collaborate their care with an outside provider. Patient/Guardian was advised if they have not already done so to contact the registration department to sign all necessary forms in order for [place] to release information regarding their care.   Consent: Patient/Guardian gives verbal consent for treatment and assignment of benefits for services provided during this visit.  Patient/Guardian expressed understanding and agreed to proceed.    [name], MD 09/16/2022, 1:28 PM

## 2022-10-03 ENCOUNTER — Telehealth (HOSPITAL_COMMUNITY): Payer: Self-pay | Admitting: *Deleted

## 2022-10-03 NOTE — Telephone Encounter (Signed)
Opened in Error.

## 2022-10-07 ENCOUNTER — Telehealth (HOSPITAL_COMMUNITY): Payer: 59 | Admitting: Psychiatry

## 2022-10-15 ENCOUNTER — Telehealth (INDEPENDENT_AMBULATORY_CARE_PROVIDER_SITE_OTHER): Payer: 59 | Admitting: Psychiatry

## 2022-10-15 ENCOUNTER — Encounter (HOSPITAL_COMMUNITY): Payer: Self-pay | Admitting: Psychiatry

## 2022-10-15 DIAGNOSIS — F39 Unspecified mood [affective] disorder: Secondary | ICD-10-CM

## 2022-10-15 DIAGNOSIS — F411 Generalized anxiety disorder: Secondary | ICD-10-CM

## 2022-10-15 DIAGNOSIS — R5382 Chronic fatigue, unspecified: Secondary | ICD-10-CM

## 2022-10-15 NOTE — Progress Notes (Signed)
Virtual Visit via Video Note  I connected with Erin Pierce on 10/15/22 at  4:00 PM EDT by a video enabled telemedicine application and verified that I am speaking with the correct person using two identifiers.  Location: Patient: home Provider: office   I discussed the limitations of evaluation and management by telemedicine and the availability of in person appointments. The patient expressed understanding and agreed to proceed.      I discussed the assessment and treatment plan with the patient. The patient was provided an opportunity to ask questions and all were answered. The patient agreed with the plan and demonstrated an understanding of the instructions.   The patient was advised to call back or seek an in-person evaluation if the symptoms worsen or if the condition fails to improve as anticipated.  I provided 15 minutes of non-face-to-face time during this encounter.   Erin Ruder, MD  [facility] MD/PA/NP OP Progress Note  10/15/2022 4:17 PM Erin Pierce  MRN:  [id number]  Chief Complaint:  Chief Complaint  Patient presents with   Fatigue   Depression   ADD   Follow-up   HPI: This patient is a 32 year old white female who is living with a boyfriend in [place].  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   The patient mother return for follow-up regarding her chronic fatigue, severe anxiety, depression and insomnia as well as ADD.  She still is not feeling much better.  The holistic medicine doctor that she goes to is going to start a course of several antibiotics including dapsone which is used to treat malaria and leprosy.  She is anticipating she is going to have a lot of side effects.  They have told her that she can take the Adderall during this regimen.  She is still allowed to take the Valium which she uses to help with the anxiety and sleep.  She does not feel like she can return to work  because she is still not able to function and hardly gets out of bed through the day.  She tried to drive and had very slow reaction time.  She is depressed but mainly because of her condition.  She tried to get Belsomra for sleep but it caused weight too much and she is just using the Valium.  She denies any thoughts of self-harm or suicide. Visit Diagnosis:    ICD-10-CM   1. Chronic fatigue  R53.82     2. Episodic mood disorder (HCC)  F39     3. Generalized anxiety disorder  F41.1       Past Psychiatric History: Patient saw psychiatrist in [place] 7 years ago for treatment with Adderall for ADHD.  She was seen by psychiatrist at the [facility].  She has been looking for a therapist and has been in therapy on and off since high school    Past Medical History:  Past Medical History:  Diagnosis Date   Adductor tendinitis dx 2018   bilateral goin pain--- hx PRP treatment's 2019 @ [facility]   ADHD (attention deficit hyperactivity disorder)    Bilateral groin pain    Chronic fatigue 03/13/2022   Chronic insomnia    Chronic pain 03/13/2022   Chronic pain syndrome    secondary to trauma sport injury   Chronic pain syndrome    Confusion 03/13/2022   Fatigue    H/O cold sores    Plantar fasciitis of left foot  Seasonal affective disorder (HCC) 2013    Past Surgical History:  Procedure Laterality Date   LAPAROSCOPIC INGUINAL HERNIA REPAIR Bilateral 06-02-2015  in [place], [place]   w/ mesh   LAPAROSCOPIC TUBAL LIGATION Bilateral 10/19/2018   Procedure: LAPAROSCOPIC TUBAL LIGATION;  Surgeon: Richardean Chimera, MD;  Location[facility];  Service: Gynecology;  Laterality: Bilateral;    Family Psychiatric History: See below  Family History:  Family History  Problem Relation Age of Onset   ADD / ADHD Mother    Anxiety disorder Father    Asthma Maternal Grandmother    Stroke Maternal Grandmother    Diabetes Maternal Grandmother     Social History:  Social History    Socioeconomic History   Marital status: Single    Spouse name: Not on file   Number of children: 0   Years of education: Not on file   Highest education level: Not on file  Occupational History    Employer: [employer]  Tobacco Use   Smoking status: Former    Packs/day: 0.50    Years: 5.00    Additional pack years: 0.00    Total pack years: 2.50    Types: Cigarettes    Quit date: 10/14/2011    Years since quitting: 11.0   Smokeless tobacco: Never  Vaping Use   Vaping Use: Never used  Substance and Sexual Activity   Alcohol use: Yes    Comment: 2-3 per day   Drug use: Not Currently    Types: Marijuana   Sexual activity: Yes    Birth control/protection: Surgical  Other Topics Concern   Not on file  Social History Narrative   Not on file   Social Determinants of Health   Financial Resource Strain: Not on file  Food Insecurity: Not on file  Transportation Needs: Not on file  Physical Activity: Not on file  Stress: Not on file  Social Connections: Not on file    Allergies: No Known Allergies  Metabolic Disorder Labs: No results found for: "HGBA1C", "MPG" No results found for: "PROLACTIN" No results found for: "CHOL", "TRIG", "HDL", "CHOLHDL", "VLDL", "LDLCALC" Lab Results  Component Value Date   TSH 1.314 03/26/2019    Therapeutic Level Labs: No results found for: "LITHIUM" No results found for: "VALPROATE" No results found for: "CBMZ"  Current Medications: Current Outpatient Medications  Medication Sig Dispense Refill   amphetamine-dextroamphetamine (ADDERALL XR) 20 MG 24 hr capsule Take 1 capsule (20 mg total) by mouth daily. 30 capsule 0   diazepam (VALIUM) 5 MG tablet Take 1 tablet (5 mg total) by mouth 3 (three) times daily as needed for anxiety. 90 tablet 2   ipratropium (ATROVENT) 0.03 % nasal spray Place 2 sprays into both nostrils every 12 (twelve) hours.     itraconazole (SPORANOX) 100 MG capsule Take 100 mg by mouth 2 (two) times daily.      neomycin-colistin-hydrocortisone-thonzonium (CORTISPORIN TC) 3.06-15-08-0.5 MG/ML OTIC suspension 4 (four) times daily.     tretinoin (RETIN-A) 0.025 % cream Apply topically at bedtime.     No current facility-administered medications for this visit.     Musculoskeletal: Strength & Muscle Tone: decreased Gait & Station: normal Patient leans: N/A  Psychiatric Specialty Exam: Review of Systems  Constitutional:  Positive for fatigue.  Musculoskeletal:  Positive for myalgias.  Psychiatric/Behavioral:  Positive for decreased concentration and sleep disturbance. The patient is nervous/anxious.   All other systems reviewed and are negative.   There were no vitals taken for this  visit.There is no height or weight on file to calculate BMI.  General Appearance: Casual and Fairly Groomed  Eye Contact:  Good  Speech:  Clear and Coherent  Volume:  Normal  Mood:  Anxious and Dysphoric  Affect:  Flat  Thought Process:  Goal Directed  Orientation:  Full (Time, Place, and Person)  Thought Content: Rumination   Suicidal Thoughts:  No  Homicidal Thoughts:  No  Memory:  Immediate;   Good Recent;   Good Remote;   NA  Judgement:  Good  Insight:  Good  Psychomotor Activity:  Decreased  Concentration:  Concentration: Poor and Attention Span: Poor  Recall:  Good  Fund of Knowledge: Good  Language: Good  Akathisia:  No  Handed:  Right  AIMS (if indicated): not done  Assets:  Communication Skills Desire for Improvement Resilience Social Support Talents/Skills  ADL's:  Intact  Cognition: WNL  Sleep:  Poor   Screenings: GAD-7    Flowsheet Row Office Visit from 01/15/2018 in Primary Care at [place]  Total GAD-7 Score 1      PHQ2-9    Flowsheet Row Office Visit from 03/13/2022 in [facility] Video Visit from 12/14/2021 in [facility] at [place] Video Visit from 11/16/2021 in [facility] at  [place] Office Visit from 10/20/2021 [facility] Office Visit from 02/19/2018 in Primary Care at [facility] Total Score 0 1 3 6 2   PHQ-9 Total Score -- -- 11 19 --      Flowsheet Row Video Visit from 12/14/2021 in [facility] at [place]  C-SSRS RISK CATEGORY No Risk        Assessment and Plan: This patient is a 32 year old female with a history of ADHD chronic fatigue chronic pain depression anxiety.  She tells me she has now been diagnosed with Lyme disease and is going to go on some sort of antibiotic protocol.  She is not taking any of the psychiatric medications except Valium right now.  I have agreed to fill out paperwork to get an extension of her short-term disability for 8 more weeks as she is unable to function by her report.  She will return to see me in 6 weeks  Collaboration of Care: Collaboration of Care: Primary Care Provider AEB notes will be shared with PCP at patient's request  Patient/Guardian was advised Release of Information must be obtained prior to any record release in order to collaborate their care with an outside provider. Patient/Guardian was advised if they have not already done so to contact the registration department to sign all necessary forms in order for [place] to release information regarding their care.   Consent: Patient/Guardian gives verbal consent for treatment and assignment of benefits for services provided during this visit. Patient/Guardian expressed understanding and agreed to proceed.    Erin Ruder, MD 10/15/2022, 4:17 PM

## 2022-10-24 ENCOUNTER — Telehealth (HOSPITAL_COMMUNITY): Payer: Self-pay | Admitting: *Deleted

## 2022-10-24 NOTE — Telephone Encounter (Signed)
Patient moth stated she trust Dr. Tenny Craw opinion better. Per pt mother she would like to know if provider could recommend any Neurologist office for patient so she can provide that to her PCP to refer patient to them. Call mother with name of recommendation for Neurologist. [phone].

## 2022-10-24 NOTE — Telephone Encounter (Signed)
Most PCPs in the [facility] system refer to [facility] Neu[facility] and they are very good

## 2022-10-28 NOTE — Telephone Encounter (Signed)
LMOM

## 2022-11-27 ENCOUNTER — Ambulatory Visit: Payer: 59 | Admitting: Neurology

## 2023-01-29 ENCOUNTER — Telehealth (HOSPITAL_COMMUNITY): Payer: Self-pay

## 2023-01-29 ENCOUNTER — Other Ambulatory Visit (HOSPITAL_COMMUNITY): Payer: Self-pay | Admitting: Psychiatry

## 2023-01-29 MED ORDER — DIAZEPAM 5 MG PO TABS: 5.0000 mg | ORAL_TABLET | Freq: Three times a day (TID) | ORAL | 2 refills | Status: DC | PRN

## 2023-01-29 NOTE — Telephone Encounter (Signed)
Pt called in requesting a refill on her diazepam (VALIUM) 5 MG tablet sent to [facility] on [address]. Pt scheduled 02/03/23. Please advise.

## 2023-01-30 NOTE — Telephone Encounter (Signed)
Spoke with pt advised rx has been sent she verbalized understanding

## 2023-02-03 ENCOUNTER — Telehealth (INDEPENDENT_AMBULATORY_CARE_PROVIDER_SITE_OTHER): Payer: 59 | Admitting: Psychiatry

## 2023-02-03 ENCOUNTER — Encounter (HOSPITAL_COMMUNITY): Payer: Self-pay | Admitting: Psychiatry

## 2023-02-03 DIAGNOSIS — F902 Attention-deficit hyperactivity disorder, combined type: Secondary | ICD-10-CM | POA: Diagnosis not present

## 2023-02-03 DIAGNOSIS — F411 Generalized anxiety disorder: Secondary | ICD-10-CM | POA: Diagnosis not present

## 2023-02-03 DIAGNOSIS — R5382 Chronic fatigue, unspecified: Secondary | ICD-10-CM

## 2023-02-03 DIAGNOSIS — F39 Unspecified mood [affective] disorder: Secondary | ICD-10-CM

## 2023-02-03 NOTE — Progress Notes (Signed)
Virtual Visit via Video Note  I connected with Erin Pierce on 02/03/23 at  1:00 PM EDT by a video enabled telemedicine application and verified that I am speaking with the correct person using two identifiers.  Location: Patient: home Provider: office   I discussed the limitations of evaluation and management by telemedicine and the availability of in person appointments. The patient expressed understanding and agreed to proceed.      I discussed the assessment and treatment plan with the patient. The patient was provided an opportunity to ask questions and all were answered. The patient agreed with the plan and demonstrated an understanding of the instructions.   The patient was advised to call back or seek an in-person evaluation if the symptoms worsen or if the condition fails to improve as anticipated.  I provided 20 minutes of non-face-to-face time during this encounter.   [name], MD  [facility] MD/PA/NP OP Progress Note  02/03/2023 1:16 PM Erin Pierce  MRN:  [id number]  Chief Complaint:  Chief Complaint  Patient presents with   Anxiety   ADD   Fatigue   Follow-up   HPI:  This patient is a 31 year old white female who is living with a boyfriend in [place].  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   The patient returns for follow-up regarding her chronic fatigue anxiety depression insomnia and ADD.  She was last seen 3 months ago.  She states in August unfortunately she contracted COVID and is put her into a relapse.  She is suffering from more fatigue and anxiety.  The Valium has been helpful for the anxiety.  It is harder for her to do much more than just get out of bed.  She is trying to do just of few short walks.  She had plan to try to go back to work part-time and to ride her horse but she does not think either of these are possible.  She is still seeing a holistic medicine doctor who is  prescribing dapsone as various other antibiotics.  The patient states that she is depressed about her situation but trying to stay hopeful.  She denies thoughts of self-harm or suicide.  She has not used the Adderall because of possible interactions with some of her other medicines.  She does not feel ready to return to work even part-time. Visit Diagnosis:    ICD-10-CM   1. Chronic fatigue  R53.82     2. Episodic mood disorder (HCC)  F39     3. Generalized anxiety disorder  F41.1     4. Attention deficit hyperactivity disorder (ADHD), combined type  F90.2       Past Psychiatric History: : Patient saw psychiatrist in [place] 7 years ago for treatment with Adderall for ADHD.  She was seen by psychiatrist at the [facility] clinic.  She has been looking for a therapist and has been in therapy on and off since high school    Past Medical History:  Past Medical History:  Diagnosis Date   Adductor tendinitis dx 2018   bilateral goin pain--- hx PRP treatment's 2019 @ [facility]   ADHD (attention deficit hyperactivity disorder)    Bilateral groin pain    Chronic fatigue 03/13/2022   Chronic insomnia    Chronic pain 03/13/2022   Chronic pain syndrome    secondary to trauma sport injury   Chronic pain syndrome    Confusion 03/13/2022  Fatigue    H/O cold sores    Plantar fasciitis of left foot    Seasonal affective disorder (HCC) 2013    Past Surgical History:  Procedure Laterality Date   LAPAROSCOPIC INGUINAL HERNIA REPAIR Bilateral 06-02-2015  in [place], [place]   w/ mesh   LAPAROSCOPIC TUBAL LIGATION Bilateral 10/19/2018   Procedure: LAPAROSCOPIC TUBAL LIGATION;  Surgeon: Richardean Chimera, MD;  Location: [facility];  Service: Gynecology;  Laterality: Bilateral;    Family Psychiatric History: See below  Family History:  Family History  Problem Relation Age of Onset   ADD / ADHD Mother    Anxiety disorder Father    Asthma Maternal Grandmother    Stroke Maternal  Grandmother    Diabetes Maternal Grandmother     Social History:  Social History   Socioeconomic History   Marital status: Single    Spouse name: Not on file   Number of children: 0   Years of education: Not on file   Highest education level: Not on file  Occupational History    Employer: [employer]  Tobacco Use   Smoking status: Former    Current packs/day: 0.00    Average packs/day: 0.5 packs/day for 5.0 years (2.5 ttl pk-yrs)    Types: Cigarettes    Start date: 10/14/2006    Quit date: 10/14/2011    Years since quitting: 11.3   Smokeless tobacco: Never  Vaping Use   Vaping status: Never Used  Substance and Sexual Activity   Alcohol use: Yes    Comment: 2-3 per day   Drug use: Not Currently    Types: Marijuana   Sexual activity: Yes    Birth control/protection: Surgical  Other Topics Concern   Not on file  Social History Narrative   Not on file   Social Determinants of Health   Financial Resource Strain: Not on file  Food Insecurity: No Food Insecurity (06/22/2020)   [facility]   Hunger Vital Sign    Worried About Running Out of Food in the Last Year: Never true    Ran Out of Food in the Last Year: Never true  Transportation Needs: Not on file  Physical Activity: Not on file  Stress: Not on file  Social Connections: Unknown (08/28/2021)   Received from [facility]   Social Network    Social Network: Not on file    Allergies: No Known Allergies  Metabolic Disorder Labs: No results found for: "HGBA1C", "MPG" No results found for: "PROLACTIN" No results found for: "CHOL", "TRIG", "HDL", "CHOLHDL", "VLDL", "LDLCALC" Lab Results  Component Value Date   TSH 1.314 03/26/2019    Therapeutic Level Labs: No results found for: "LITHIUM" No results found for: "VALPROATE" No results found for: "CBMZ"  Current Medications: Current Outpatient Medications  Medication Sig Dispense Refill   dapsone 25 MG  tablet Take by mouth.     leucovorin (WELLCOVORIN) 15 MG tablet Take 15 mg by mouth 2 (two) times daily.     rifabutin (MYCOBUTIN) 150 MG capsule Take 150 mg by mouth 2 (two) times daily.     diazepam (VALIUM) 5 MG tablet Take 1 tablet (5 mg total) by mouth 3 (three) times daily as needed for anxiety. 90 tablet 2   ipratropium (ATROVENT) 0.03 % nasal spray Place 2 sprays into both nostrils every 12 (twelve) hours.     itraconazole (SPORANOX) 100 MG capsule Take 100 mg by mouth 2 (two) times daily.  neomycin-colistin-hydrocortisone-thonzonium (CORTISPORIN TC) 3.06-15-08-0.5 MG/ML OTIC suspension 4 (four) times daily.     tretinoin (RETIN-A) 0.025 % cream Apply topically at bedtime.     No current facility-administered medications for this visit.     Musculoskeletal: Strength & Muscle Tone: within normal limits Gait & Station: normal Patient leans: N/A  Psychiatric Specialty Exam: Review of Systems  Constitutional:  Positive for fatigue.  Musculoskeletal:  Positive for myalgias.  Psychiatric/Behavioral:  The patient is nervous/anxious.   All other systems reviewed and are negative.   There were no vitals taken for this visit.There is no height or weight on file to calculate BMI.  General Appearance: Casual and Fairly Groomed  Eye Contact:  Good  Speech:  Clear and Coherent  Volume:  Normal  Mood:  Anxious  Affect:  Congruent  Thought Process:  Goal Directed  Orientation:  Full (Time, Place, and Person)  Thought Content: Rumination   Suicidal Thoughts:  No  Homicidal Thoughts:  No  Memory:  Immediate;   Good Recent;   Good Remote;   NA  Judgement:  Good  Insight:  Fair  Psychomotor Activity:  Decreased  Concentration:  Concentration: Poor and Attention Span: Poor  Recall:  Good  Fund of Knowledge: Good  Language: Good  Akathisia:  No  Handed:  Right  AIMS (if indicated): not done  Assets:  Communication Skills Desire for Improvement Physical  Health Resilience Social Support Talents/Skills  ADL's:  Intact  Cognition: WNL  Sleep:  Good   Screenings: GAD-7    Flowsheet Row Office Visit from 01/15/2018 in Primary Care at [place]  Total GAD-7 Score 1      PHQ2-9    Flowsheet Row Office Visit from 03/13/2022 in [facility] for [facility] Video Visit from 12/14/2021 in [facility] at [place] Video Visit from 11/16/2021 in [facility] at [place] Office Visit from 10/20/2021 in [facility] Office Visit from 02/19/2018 in Primary Care at [facility] Total Score 0 1 3 6 2   PHQ-9 Total Score -- -- 11 19 --      Flowsheet Row Video Visit from 12/14/2021 in [facility] at [place]  C-SSRS RISK CATEGORY No Risk        Assessment and Plan: This patient is a 32 year old female with a history of ADHD, chronic fatigue chronic pain depression anxiety she is on an antibiotic/antimalarial/antifungal prophy call for presumed Lyme disease through a functional medicine physician.  At this point she has severe fatigue particularly since she had COVID in August and does not feel that she is able to work.  I will fill out some paperwork to support this.  She will return to see me in 2 months.  In the meantime she will continue Valium 5 mg 3 times daily as needed for anxiety.  Collaboration of Care: Collaboration of Care: Primary Care Provider AEB notes to be shared with PCP at patient's request  Patient/Guardian was advised Release of Information must be obtained prior to any record release in order to collaborate their care with an outside provider. Patient/Guardian was advised if they have not already done so to contact the registration department to sign all necessary forms in order for [place] to release information regarding their care.   Consent: Patient/Guardian gives verbal consent for  treatment and assignment of benefits for services provided during this visit. Patient/Guardian expressed understanding and agreed to proceed.    Gavin Pound  Tenny Craw, MD 02/03/2023, 1:16 PM

## 2023-02-19 ENCOUNTER — Telehealth (HOSPITAL_COMMUNITY): Payer: Self-pay

## 2023-02-19 NOTE — Telephone Encounter (Signed)
Dr Rudolpho Sevin with [employer] is calling in regards to disability and is requesting to speak with Dr Tenny Craw states that Dr Tenny Craw has stated that pt is unable to work due to mental health prospective but he needs more clarification and wants to speak with Dr  Tenny Craw. He is available 9:00 AM-4:30 PM eastern standard time. Call back number is [phone]

## 2023-02-20 NOTE — Telephone Encounter (Signed)
Okay; thanks.

## 2023-02-20 NOTE — Telephone Encounter (Signed)
Scheduled for 02/24/23 at 2:00

## 2023-02-24 ENCOUNTER — Telehealth (HOSPITAL_COMMUNITY): Payer: Self-pay | Admitting: Psychiatry

## 2023-10-21 ENCOUNTER — Telehealth (INDEPENDENT_AMBULATORY_CARE_PROVIDER_SITE_OTHER): Admitting: Psychiatry

## 2023-10-21 ENCOUNTER — Encounter (HOSPITAL_COMMUNITY): Payer: Self-pay | Admitting: Psychiatry

## 2023-10-21 DIAGNOSIS — R5382 Chronic fatigue, unspecified: Secondary | ICD-10-CM

## 2023-10-21 DIAGNOSIS — F411 Generalized anxiety disorder: Secondary | ICD-10-CM

## 2023-10-21 MED ORDER — DIAZEPAM 5 MG PO TABS: 5.0000 mg | ORAL_TABLET | Freq: Three times a day (TID) | ORAL | 2 refills | Status: DC | PRN

## 2023-10-21 NOTE — Progress Notes (Signed)
 Virtual Visit via Video Note  I connected with Erin Pierce on 10/21/23 at  3:20 PM EDT by a video enabled telemedicine application and verified that I am speaking with the correct person using two identifiers.  Location: Patient: home Provider: office   I discussed the limitations of evaluation and management by telemedicine and the availability of in person appointments. The patient expressed understanding and agreed to proceed.    I discussed the assessment and treatment plan with the patient. The patient was provided an opportunity to ask questions and all were answered. The patient agreed with the plan and demonstrated an understanding of the instructions.   The patient was advised to call back or seek an in-person evaluation if the symptoms worsen or if the condition fails to improve as anticipated.  I provided 20 minutes of non-face-to-face time during this encounter.   [name], MD  [facility] MD/PA/NP OP Progress Note  10/21/2023 3:37 PM Erin Pierce  MRN:  [id number]  Chief Complaint:  Chief Complaint  Patient presents with   Anxiety   Follow-up   HPI: This patient is a 33 year old white female who lives alone in [place].  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  The patient returns for follow-up after approximately 9 months regarding her anxiety difficulty with focus low energy and chronic fatigue as well as history of depression.  She is going to a functional medicine clinic where she has been diagnosed with Lyme disease.  She is on a protocol of Plaquenil various antibiotics and antifungal agents.  She has been on this for several months and she thinks it is beginning to help a little.  She still spends a lot of time in bed and has a significant fatigue.  She recently refilled the Valium  that I prescribed because it helps with her anxiety and sleep.  She denies significant depression or thoughts of self-harm.  She is just  trying to get back to some sense of normality which has been difficult.  Her mother has been helping a good deal.  She is trying to eat healthy food but can always manage it every day Visit Diagnosis:    ICD-10-CM   1. Chronic fatigue  R53.82     2. Generalized anxiety disorder  F41.1       Past Psychiatric History:  Patient saw psychiatrist in [place]  7 years ago for treatment with Adderall for ADHD.  She was seen by psychiatrist at the [facility] clinic.  She has been looking for a therapist and has been in therapy on and off since high school   Past Medical History:  Past Medical History:  Diagnosis Date   Adductor tendinitis dx 2018   bilateral goin pain--- hx PRP treatment's 2019 @ [facility]   ADHD (attention deficit hyperactivity disorder)    Bilateral groin pain    Chronic fatigue 03/13/2022   Chronic insomnia    Chronic pain 03/13/2022   Chronic pain syndrome    secondary to trauma sport injury   Chronic pain syndrome    Confusion 03/13/2022   Fatigue    H/O cold sores    Plantar fasciitis of left foot    Seasonal affective disorder (HCC) 2013    Past Surgical History:  Procedure Laterality Date   LAPAROSCOPIC INGUINAL HERNIA REPAIR Bilateral 06-02-2015  in [place], [place]   w/ mesh   LAPAROSCOPIC TUBAL LIGATION Bilateral 10/19/2018   Procedure: LAPAROSCOPIC TUBAL LIGATION;  Surgeon: Leva Rush, MD;  Location: [facility];  Service: Gynecology;  Laterality: Bilateral;    Family Psychiatric History: See below  Family History:  Family History  Problem Relation Age of Onset   ADD / ADHD Mother    Anxiety disorder Father    Asthma Maternal Grandmother    Stroke Maternal Grandmother    Diabetes Maternal Grandmother     Social History:  Social History   Socioeconomic History   Marital status: Single    Spouse name: Not on file   Number of children: 0   Years of education: Not on file   Highest education level: Not on file  Occupational History     Employer: [employer]  Tobacco Use   Smoking status: Former    Current packs/day: 0.00    Average packs/day: 0.5 packs/day for 5.0 years (2.5 ttl pk-yrs)    Types: Cigarettes    Start date: 10/14/2006    Quit date: 10/14/2011    Years since quitting: 12.0   Smokeless tobacco: Never  Vaping Use   Vaping status: Never Used  Substance and Sexual Activity   Alcohol use: Yes    Comment: 2-3 per day   Drug use: Not Currently    Types: Marijuana   Sexual activity: Yes    Birth control/protection: Surgical  Other Topics Concern   Not on file  Social History Narrative   Not on file   Social Drivers of Health   Financial Resource Strain: Not on file  Food Insecurity: No Food Insecurity (06/22/2020)   [facility]   Hunger Vital Sign    Within the past 12 months, you worried that your food would run out before you got the money to buy more.: Never true    Within the past 12 months, the food you bought just didn't last and you didn't have money to get more.: Never true  Transportation Needs: Not on file  Physical Activity: Not on file  Stress: Not on file  Social Connections: Unknown (08/28/2021)   Received from [facility]   Social Network    Social Network: Not on file    Allergies: No Known Allergies  Metabolic Disorder Labs: No results found for: HGBA1C, MPG No results found for: PROLACTIN No results found for: CHOL, TRIG, HDL, CHOLHDL, VLDL, LDLCALC Lab Results  Component Value Date   TSH 1.314 03/26/2019    Therapeutic Level Labs: No results found for: LITHIUM No results found for: VALPROATE No results found for: CBMZ  Current Medications: Current Outpatient Medications  Medication Sig Dispense Refill   hydroxychloroquine (PLAQUENIL) 200 MG tablet Take 200 mg by mouth 2 (two) times daily.     dapsone 25 MG tablet Take by mouth.     diazepam  (VALIUM ) 5 MG tablet Take 1 tablet (5 mg total) by mouth 3 (three) times daily as  needed for anxiety. 90 tablet 2   ipratropium (ATROVENT) 0.03 % nasal spray Place 2 sprays into both nostrils every 12 (twelve) hours.     leucovorin (WELLCOVORIN) 15 MG tablet Take 15 mg by mouth 2 (two) times daily.     rifabutin (MYCOBUTIN) 150 MG capsule Take 150 mg by mouth 2 (two) times daily.     tretinoin (RETIN-A) 0.025 % cream Apply topically at bedtime.     No current facility-administered medications for this visit.     Musculoskeletal: Strength & Muscle Tone: decreased Gait & Station: normal Patient leans: N/A  Psychiatric Specialty Exam: Review of Systems  Constitutional:  Positive for fatigue.  Musculoskeletal:  Positive for myalgias.  Psychiatric/Behavioral:  Positive for sleep disturbance. The patient is nervous/anxious.   All other systems reviewed and are negative.   There were no vitals taken for this visit.There is no height or weight on file to calculate BMI.  General Appearance: Casual and Fairly Groomed  Eye Contact:  Good  Speech:  Clear and Coherent  Volume:  Normal  Mood:  Anxious  Affect:  Flat  Thought Process:  Goal Directed  Orientation:  Full (Time, Place, and Person)  Thought Content: Rumination   Suicidal Thoughts:  No  Homicidal Thoughts:  No  Memory:  Immediate;   Good Recent;   Good Remote;   Good  Judgement:  Good  Insight:  Good  Psychomotor Activity:  Decreased  Concentration:  Concentration: Fair and Attention Span: Fair  Recall:  Good  Fund of Knowledge: Good  Language: Good  Akathisia:  No  Handed:  Right  AIMS (if indicated): not done  Assets:  Communication Skills Desire for Improvement Resilience Social Support  ADL's:  Intact  Cognition: WNL  Sleep:  Fair   Screenings: GAD-7    Flowsheet Row Office Visit from 01/15/2018 in Primary Care at [place]  Total GAD-7 Score 1   PHQ2-9    Flowsheet Row Office Visit from 03/13/2022 in [facility] [place] Video Visit  from 12/14/2021 in [facility] at [place] Video Visit from 11/16/2021 in [facility] at [place] Office Visit from [facility]20[facility] Office Visit from 02/19/2018 in Primary [facility] at [facility] Total Score 0 1 3 6 2   PHQ-9 Total Score -- -- 11 19 --   Flowsheet Row Video Visit from 12/14/2021 in [facility] at [place]  C-SSRS RISK CATEGORY No Risk     Assessment and Plan: This patient is a 33 year old female with a history of ADHD chronic fatigue chronic pain depression.  She has been diagnosed with Lyme disease and is on a protocol for this.  She also had COVID last year which made her fatigue worse.  She does have anxiety associated with all this and the Valium  5 mg 3 times daily as needed has helped.  This will be continued and she will return to see me in 3 months  Collaboration of Care: Collaboration of Care: Primary Care Provider AEB notes to be shared with PCP at patient's request  Patient/Guardian was advised Release of Information must be obtained prior to any record release in order to collaborate their care with an outside provider. Patient/Guardian was advised if they have not already done so to contact the registration department to sign all necessary forms in order for us  to release information regarding their care.   Consent: Patient/Guardian gives verbal consent for treatment and assignment of benefits for services provided during this visit. Patient/Guardian expressed understanding and agreed to proceed.    [name], MD 10/21/2023, 3:37 PM

## 2024-01-02 ENCOUNTER — Telehealth (HOSPITAL_COMMUNITY): Admitting: Psychiatry

## 2024-01-06 ENCOUNTER — Telehealth (HOSPITAL_COMMUNITY): Admitting: Psychiatry

## 2024-01-08 ENCOUNTER — Telehealth (HOSPITAL_COMMUNITY): Admitting: Psychiatry

## 2024-01-08 ENCOUNTER — Encounter (HOSPITAL_COMMUNITY): Payer: Self-pay | Admitting: Psychiatry

## 2024-01-08 DIAGNOSIS — F411 Generalized anxiety disorder: Secondary | ICD-10-CM

## 2024-01-08 DIAGNOSIS — F902 Attention-deficit hyperactivity disorder, combined type: Secondary | ICD-10-CM | POA: Diagnosis not present

## 2024-01-08 DIAGNOSIS — R5382 Chronic fatigue, unspecified: Secondary | ICD-10-CM

## 2024-01-08 MED ORDER — AMPHETAMINE-DEXTROAMPHETAMINE 5 MG PO TABS: 5.0000 mg | ORAL_TABLET | ORAL | 0 refills | Status: DC

## 2024-01-08 MED ORDER — AMPHETAMINE-DEXTROAMPHETAMINE 5 MG PO TABS: 5.0000 mg | ORAL_TABLET | Freq: Every day | ORAL | 0 refills | Status: DC

## 2024-01-08 MED ORDER — DIAZEPAM 5 MG PO TABS: 5.0000 mg | ORAL_TABLET | Freq: Three times a day (TID) | ORAL | 2 refills | Status: DC | PRN

## 2024-01-08 NOTE — Progress Notes (Signed)
 Virtual Visit via Video Note  I connected with Erin Pierce on 01/08/24 at  1:00 PM EDT by a video enabled telemedicine application and verified that I am speaking with the correct person using two identifiers.  Location: Patient: home Provider: office   I discussed the limitations of evaluation and management by telemedicine and the availability of in person appointments. The patient expressed understanding and agreed to proceed.     I discussed the assessment and treatment plan with the patient. The patient was provided an opportunity to ask questions and all were answered. The patient agreed with the plan and demonstrated an understanding of the instructions.   The patient was advised to call back or seek an in-person evaluation if the symptoms worsen or if the condition fails to improve as anticipated.  I provided 20 minutes of non-face-to-face time during this encounter.   [name], MD  [facility] MD/PA/NP OP Progress Note  01/08/2024 1:17 PM Erin Pierce  MRN:  [id number]  Chief Complaint:  Chief Complaint  Patient presents with   Anxiety   Fatigue   ADD   Follow-up   HPI: This patient is a 33 year old white female who lives with her boyfriend in [place].  She was working for [employer] but is now unemployed.  The patient returns for follow-up after 3 months regarding chronic fatigue anxiety depression insomnia and ADD.  She states that over the summer she took a break from her Lyme disease treatment that she was getting from a functional medicine office.  This included antibiotics and drugs like dapsone and leucovorin.  However she just went back on the treatment today.  She states over the last couple of weeks she has felt increasingly fatigued and unable to get out of bed in the mornings.  She is not sure why because nothing really has changed.  She finds that the Valium  does help with her anxiety.  She is gone back to a low-dose of Adderall and asked if we can resume  this probably at 5 mg since the 10 is a little bit too much for her.  She states her sleep is variable but she is eating fairly well.  She denies being depressed but just feels tired all the time. Visit Diagnosis:    ICD-10-CM   1. Chronic fatigue  R53.82     2. Generalized anxiety disorder  F41.1     3. Attention deficit hyperactivity disorder (ADHD), combined type  F90.2       Past Psychiatric History: Patient saw psychiatrist in [place]  7 years ago for treatment with Adderall for ADHD.  She was seen by psychiatrist at the [facility] clinic.  She has been looking for a therapist and has been in therapy on and off since high school   Past Medical History:  Past Medical History:  Diagnosis Date   Adductor tendinitis dx 2018   bilateral goin pain--- hx PRP treatment's 2019 @ [facility]   ADHD (attention deficit hyperactivity disorder)    Bilateral groin pain    Chronic fatigue 03/13/2022   Chronic insomnia    Chronic pain 03/13/2022   Chronic pain syndrome    secondary to trauma sport injury   Chronic pain syndrome    Confusion 03/13/2022   Fatigue    H/O cold sores    Plantar fasciitis of left foot    Seasonal affective disorder 2013    Past Surgical History:  Procedure Laterality Date   LAPAROSCOPIC INGUINAL HERNIA REPAIR Bilateral 06-02-2015  in [place],  [place]   w/ mesh   LAPAROSCOPIC TUBAL LIGATION Bilateral 10/19/2018   Procedure: LAPAROSCOPIC TUBAL LIGATION;  Surgeon: Leva Rush, MD;  Location: [facility];  Service: Gynecology;  Laterality: Bilateral;    Family Psychiatric History: See below  Family History:  Family History  Problem Relation Age of Onset   ADD / ADHD Mother    Anxiety disorder Father    Asthma Maternal Grandmother    Stroke Maternal Grandmother    Diabetes Maternal Grandmother     Social History:  Social History   Socioeconomic History   Marital status: Single    Spouse name: Not on file   Number of children: 0   Years of  education: Not on file   Highest education level: Not on file  Occupational History    Employer: [employer]  Tobacco Use   Smoking status: Former    Current packs/day: 0.00    Average packs/day: 0.5 packs/day for 5.0 years (2.5 ttl pk-yrs)    Types: Cigarettes    Start date: 10/14/2006    Quit date: 10/14/2011    Years since quitting: 12.2   Smokeless tobacco: Never  Vaping Use   Vaping status: Never Used  Substance and Sexual Activity   Alcohol use: Yes    Comment: 2-3 per day   Drug use: Not Currently    Types: Marijuana   Sexual activity: Yes    Birth control/protection: Surgical  Other Topics Concern   Not on file  Social History Narrative   Not on file   Social Drivers of Health   Financial Resource Strain: Not on file  Food Insecurity: No Food Insecurity (06/22/2020)   Received [facility]   Hunger Vital Sign    Within the past 12 months, you worried that your food would run out before you got the money to buy more.: Never true    Within the past 12 months, the food you bought just didn't last and you didn't have money to get more.: Never true  Transportation Needs: Not on file  Physical Activity: Not on file  Stress: Not on file  Social Connections: Unknown (08/28/2021)   Received from [facility]   Social Network    Social Network: Not on file    Allergies: No Known Allergies  Metabolic Disorder Labs: No results found for: HGBA1C, MPG No results found for: PROLACTIN No results found for: CHOL, TRIG, HDL, CHOLHDL, VLDL, LDLCALC Lab Results  Component Value Date   TSH 1.314 03/26/2019    Therapeutic Level Labs: No results found for: LITHIUM No results found for: VALPROATE No results found for: CBMZ  Current Medications: Current Outpatient Medications  Medication Sig Dispense Refill   amphetamine -dextroamphetamine  (ADDERALL) 5 MG tablet Take 1 tablet (5 mg total) by mouth every morning. 30 tablet 0    amphetamine -dextroamphetamine  (ADDERALL) 5 MG tablet Take 1 tablet (5 mg total) by mouth every morning. 30 tablet 0   amphetamine -dextroamphetamine  (ADDERALL) 5 MG tablet Take 1 tablet (5 mg total) by mouth daily. 30 tablet 0   dapsone 25 MG tablet Take by mouth.     diazepam  (VALIUM ) 5 MG tablet Take 1 tablet (5 mg total) by mouth 3 (three) times daily as needed for anxiety. 90 tablet 2   hydroxychloroquine (PLAQUENIL) 200 MG tablet Take 200 mg by mouth 2 (two) times daily.     ipratropium (ATROVENT) 0.03 % nasal spray Place 2 sprays into both nostrils every 12 (twelve) hours.  leucovorin (WELLCOVORIN) 15 MG tablet Take 15 mg by mouth 2 (two) times daily.     rifabutin (MYCOBUTIN) 150 MG capsule Take 150 mg by mouth 2 (two) times daily.     tretinoin (RETIN-A) 0.025 % cream Apply topically at bedtime.     No current facility-administered medications for this visit.     Musculoskeletal: Strength & Muscle Tone: within normal limits Gait & Station: normal Patient leans: N/A  Psychiatric Specialty Exam: Review of Systems  Constitutional:  Positive for fatigue.  Psychiatric/Behavioral:  Positive for decreased concentration. The patient is nervous/anxious.   All other systems reviewed and are negative.   There were no vitals taken for this visit.There is no height or weight on file to calculate BMI.  General Appearance: Casual and Fairly Groomed  Eye Contact:  Good  Speech:  Clear and Coherent  Volume:  Normal  Mood:  Anxious  Affect:  Congruent  Thought Process:  Goal Directed  Orientation:  Full (Time, Place, and Person)  Thought Content: Rumination   Suicidal Thoughts:  No  Homicidal Thoughts:  No  Memory:  Immediate;   Good Recent;   Good Remote;   Good  Judgement:  Good  Insight:  Fair  Psychomotor Activity:  Decreased  Concentration:  Concentration: Fair and Attention Span: Fair  Recall:  Good  Fund of Knowledge: Good  Language: Good  Akathisia:  No  Handed:  Right   AIMS (if indicated): not done  Assets:  Communication Skills Desire for Improvement Resilience Social Support  ADL's:  Intact  Cognition: WNL  Sleep:  Fair   Screenings: GAD-7    Flowsheet Row Office Visit from 01/15/2018 in [facility] at [facility]  Total GAD-7 Score 1   PHQ2-9    Flowsheet Row Office Visit from 03/13/2022 in [facility] [place] Video Visit from 12/14/2021 in [facility] at [place] Video Visit from 11/16/2021 in [facility] at [place] Office Visit from 10[facility]20/2021 in [facility] Office Visit from 02/19/2018 in [facility] Care at [facility] Total Score 0 1 3 6 2   PHQ-9 Total Score -- -- 11 19 --   Flowsheet Row Video Visit from 12/14/2021 in [facility] Outpatient Behavioral [facility] at [place]  C-SSRS RISK CATEGORY No Risk     Assessment and Plan: This patient is a 33 year old female with a history of ADHD, chronic fatigue chronic pain and depression.  She is on a Lyme disease protocol which she stopped for a while but is now going back to.  She does think the Valium  5 mg 3 times daily has helped for anxiety and she would like to resume Adderall at 5 mg daily for attention and focus.  These will be continued and she will return to see me in 3 months  Collaboration of Care: Collaboration of Care: Primary Care Provider AEB notes will be shared with PCP at patient's request  Patient/Guardian was advised Release of Information must be obtained prior to any record release in order to collaborate their care with an outside provider. Patient/Guardian was advised if they have not already done so to contact the registration department to sign all necessary forms in order for us  to release information regarding their care.   Consent: Patient/Guardian gives verbal consent for treatment and assignment of benefits for services  provided during this visit. Patient/Guardian expressed understanding and agreed to proceed.  [name], MD 01/08/2024, 1:17 PM

## 2024-03-30 ENCOUNTER — Telehealth (INDEPENDENT_AMBULATORY_CARE_PROVIDER_SITE_OTHER): Admitting: Psychiatry

## 2024-03-30 ENCOUNTER — Encounter (HOSPITAL_COMMUNITY): Payer: Self-pay | Admitting: Psychiatry

## 2024-03-30 DIAGNOSIS — F411 Generalized anxiety disorder: Secondary | ICD-10-CM

## 2024-03-30 DIAGNOSIS — R5382 Chronic fatigue, unspecified: Secondary | ICD-10-CM

## 2024-03-30 DIAGNOSIS — F902 Attention-deficit hyperactivity disorder, combined type: Secondary | ICD-10-CM

## 2024-03-30 MED ORDER — AMPHETAMINE-DEXTROAMPHETAMINE 10 MG PO TABS: 10.0000 mg | ORAL_TABLET | ORAL | 0 refills | Status: AC

## 2024-03-30 MED ORDER — DIAZEPAM 5 MG PO TABS: 5.0000 mg | ORAL_TABLET | Freq: Three times a day (TID) | ORAL | 2 refills | Status: AC | PRN

## 2024-03-30 NOTE — Progress Notes (Signed)
 Virtual Visit via Telephone Note  I connected with Erin Pierce on 03/30/2024 at  1:00 PM EST by a video enabled telemedicine application and verified that I am speaking with the correct person using two identifiers.  Location: Patient: home Provider: office   I discussed the limitations of evaluation and management by telemedicine and the availability of in person appointments. The patient expressed understanding and agreed to proceed.    I discussed the assessment and treatment plan with the patient. The patient was provided an opportunity to ask questions and all were answered. The patient agreed with the plan and demonstrated an understanding of the instructions.   The patient was advised to call back or seek an in-person evaluation if the symptoms worsen or if the condition fails to improve as anticipated.  I provided 20 minutes of non-face-to-face time during this encounter.   [name], MD  [facility] MD/PA/NP OP Progress Note  03/30/2024 1:11 PM Erin Pierce  MRN:  [id number]  Chief Complaint:  Chief Complaint  Patient presents with   Fatigue   Anxiety   ADD   HPI: This patient is a 33 year old white female who lives alone in [place].  She was working for [employer] but is now unemployed.  The patient returns for follow-up after 3 months regarding her chronic fatigue generalized anxiety and ADD.  She states that she is still on a Lyme disease protocol that includes some heavy-duty medication such as dapsone, leucovorin and Mycobutin.  She states that most of the time she still feels very exhausted.  She has good and bad days.  The Valium  is helping particularly at night to help her sleep.  She states that she feels like if she gets 1 good hour a day with better energy.  The Adderall is helping a little bit but not much and I suggested increasing the dosage from 5 to 10 mg and she is in agreement.  She denies thoughts of self-harm or suicide.   Visit Diagnosis:     ICD-10-CM   1. Chronic fatigue  R53.82     2. Generalized anxiety disorder  F41.1     3. Attention deficit hyperactivity disorder (ADHD), combined type  F90.2       Past Psychiatric History:  Patient saw psychiatrist in [place]  7 years ago for treatment with Adderall for ADHD.  She was seen by psychiatrist at the [facility].  She has been looking for a therapist and has been in therapy on and off since high school   Past Medical History:  Past Medical History:  Diagnosis Date   Adductor tendinitis dx 2018   bilateral goin pain--- hx PRP treatment's 2019 @ [facility]   ADHD (attention deficit hyperactivity disorder)    Bilateral groin pain    Chronic fatigue 03/13/2022   Chronic insomnia    Chronic pain 03/13/2022   Chronic pain syndrome    secondary to trauma sport injury   Chronic pain syndrome    Confusion 03/13/2022   Fatigue    H/O cold sores    Plantar fasciitis of left foot    Seasonal affective disorder 2013    Past Surgical History:  Procedure Laterality Date   LAPAROSCOPIC INGUINAL HERNIA REPAIR Bilateral 06-02-2015  in [place], [place]   w/ mesh   LAPAROSCOPIC TUBAL LIGATION Bilateral 10/19/2018   Procedure: LAPAROSCOPIC TUBAL LIGATION;  Surgeon: Leva Rush, MD;  Location: [facility];  Service: Gynecology;  Laterality: Bilateral;    Family Psychiatric History: See below  Family History:  Family History  Problem Relation Age of Onset   ADD / ADHD Mother    Anxiety disorder Father    Asthma Maternal Grandmother    Stroke Maternal Grandmother    Diabetes Maternal Grandmother     Social History:  Social History   Socioeconomic History   Marital status: Single    Spouse name: Not on file   Number of children: 0   Years of education: Not on file   Highest education level: Not on file  Occupational History    Employer: [employer]  Tobacco Use   Smoking status: Former    Current packs/day: 0.00    Average packs/day: 0.5 packs/day  for 5.0 years (2.5 ttl pk-yrs)    Types: Cigarettes    Start date: 10/14/2006    Quit date: 10/14/2011    Years since quitting: 12.4   Smokeless tobacco: Never  Vaping Use   Vaping status: Never Used  Substance and Sexual Activity   Alcohol use: Yes    Comment: 2-3 per day   Drug use: Not Currently    Types: Marijuana   Sexual activity: Yes    Birth control/protection: Surgical  Other Topics Concern   Not on file  Social History Narrative   Not on file   Social Drivers of Health   Tobacco Use: Medium Risk (03/30/2024)   Patient History    Smoking Tobacco Use: Former    Smokeless Tobacco Use: Never    Passive Exposure: Not on Actuary Strain: Not on file  Food Insecurity: Not on file  Transportation Needs: Not on file  Physical Activity: Not on file  Stress: Not on file  Social Connections: Unknown (08/28/2021)   Received from [facility]   Social Network    Social Network: Not on file  Depression (PHQ2-9): Low Risk (03/13/2022)   Depression (PHQ2-9)    PHQ-2 Score: 0  Alcohol Screen: Not on file  Housing: Not on file  Utilities: Not on file  Health Literacy: Not on file    Allergies: Allergies[1]  Metabolic Disorder Labs: No results found for: HGBA1C, MPG No results found for: PROLACTIN No results found for: CHOL, TRIG, HDL, CHOLHDL, VLDL, LDLCALC Lab Results  Component Value Date   TSH 1.314 03/26/2019    Therapeutic Level Labs: No results found for: LITHIUM No results found for: VALPROATE No results found for: CBMZ  Current Medications: Current Outpatient Medications  Medication Sig Dispense Refill   amphetamine -dextroamphetamine  (ADDERALL) 10 MG tablet Take 1 tablet (10 mg total) by mouth every morning. 30 tablet 0   amphetamine -dextroamphetamine  (ADDERALL) 10 MG tablet Take 1 tablet (10 mg total) by mouth every morning. 30 tablet 0   amphetamine -dextroamphetamine  (ADDERALL) 10 MG tablet Take 1 tablet (10 mg  total) by mouth every morning. 30 tablet 0   dapsone 25 MG tablet Take by mouth.     diazepam  (VALIUM ) 5 MG tablet Take 1 tablet (5 mg total) by mouth 3 (three) times daily as needed for anxiety. 90 tablet 2   hydroxychloroquine (PLAQUENIL) 200 MG tablet Take 200 mg by mouth 2 (two) times daily.     ipratropium (ATROVENT) 0.03 % nasal spray Place 2 sprays into both nostrils every 12 (twelve) hours.     leucovorin (WELLCOVORIN) 15 MG tablet Take 15 mg by mouth 2 (two) times daily.     rifabutin (MYCOBUTIN) 150 MG capsule Take 150 mg by mouth 2 (two) times daily.     tretinoin (RETIN-A)  0.025 % cream Apply topically at bedtime.     No current facility-administered medications for this visit.     Musculoskeletal: Strength & Muscle Tone: na Gait & Station: na Patient leans: N/A  Psychiatric Specialty Exam: Review of Systems  Constitutional:  Positive for fatigue.  Psychiatric/Behavioral:  Positive for decreased concentration.   All other systems reviewed and are negative.   There were no vitals taken for this visit.There is no height or weight on file to calculate BMI.  General Appearance: NA  Eye Contact:  NA  Speech:  Clear and Coherent  Volume:  Normal  Mood:  Anxious  Affect:  NA  Thought Process:  Goal Directed  Orientation:  Full (Time, Place, and Person)  Thought Content: Rumination   Suicidal Thoughts:  No  Homicidal Thoughts:  No  Memory:  Immediate;   Good Recent;   Good Remote;   Good  Judgement:  Good  Insight:  Good  Psychomotor Activity:  Decreased  Concentration:  Concentration: Poor and Attention Span: Poor  Recall:  Good  Fund of Knowledge: Good  Language: Good  Akathisia:  No  Handed:  Right  AIMS (if indicated): not done  Assets:  Communication Skills Desire for Improvement Resilience Social Support  ADL's:  Intact  Cognition: WNL  Sleep:  Fair   Screenings: GAD-7    Flowsheet Row Office Visit from 01/15/2018 in [facility] at [place]  Total  GAD-7 Score 1   PHQ2-9    Flowsheet Row Office Visit from 03/13/2022 in [facility] Video Visit from 12/14/2021 in [facility] [place] Video Visit from 11/16/2021 in [facility] [place] Office Visit from 10[facility]20/2021 in [facility] Office Visit from 02/19/2018 in [facility] at [facility] Total Score 0 1 3 6 2   PHQ-9 Total Score -- -- 11 19 --   Flowsheet Row Video Visit from 12/14/2021 in [facility] at [place]  C-SSRS RISK CATEGORY No Risk     Assessment and Plan: This patient is a 33 year old female with a history of ADHD, chronic fatigue and generalized anxiety.  She continues on a Lyme disease protocol which can have some harsh side effects.  She states that she will only stay on it for 3 months.  In the meantime her focus is not that great so we will increase Adderall to 10 mg every morning for ADD.  She will continue Valium  15 mg daily for anxiety or sleep.  She will return to see me in 3 months  Collaboration of Care: Collaboration of Care: Primary Care Provider AEB notes will be shared with PCP at patient's request  Patient/Guardian was advised Release of Information must be obtained prior to any record release in order to collaborate their care with an outside provider. Patient/Guardian was advised if they have not already done so to contact the registration department to sign all necessary forms in order for us  to release information regarding their care.   Consent: Patient/Guardian gives verbal consent for treatment and assignment of benefits for services provided during this visit. Patient/Guardian expressed understanding and agreed to proceed.    [name], MD 03/30/2024, 1:11 PM     [1] No Known Allergies
